# Patient Record
Sex: Female | Born: 1990 | Race: Black or African American | Hispanic: No | Marital: Single | Smoking: Former smoker
Health system: Southern US, Community
[De-identification: ages and names within clinical notes are randomized; demographics above are authoritative.]

## PROBLEM LIST (undated history)

## (undated) DIAGNOSIS — J40 Bronchitis, not specified as acute or chronic: Secondary | ICD-10-CM

## (undated) DIAGNOSIS — Z3480 Encounter for supervision of other normal pregnancy, unspecified trimester: Secondary | ICD-10-CM

## (undated) DIAGNOSIS — J45909 Unspecified asthma, uncomplicated: Secondary | ICD-10-CM

## (undated) DIAGNOSIS — D649 Anemia, unspecified: Secondary | ICD-10-CM

## (undated) HISTORY — DX: Encounter for supervision of other normal pregnancy, unspecified trimester: Z34.80

## (undated) HISTORY — DX: Bronchitis, not specified as acute or chronic: J40

## (undated) HISTORY — DX: Anemia, unspecified: D64.9

## (undated) HISTORY — DX: Unspecified asthma, uncomplicated: J45.909

---

## 2010-03-31 ENCOUNTER — Emergency Department (HOSPITAL_COMMUNITY): Admission: EM | Admit: 2010-03-31 | Discharge: 2010-03-31 | Payer: Self-pay | Admitting: Emergency Medicine

## 2011-03-04 IMAGING — CR DG CHEST 2V
2 series · 2 of 2 positions shown · non-contrast
Comparison: None

CLINICAL DATA: Chest tightness with shortness of breath and cough
for 3 days.  History of asthma

CHEST - 2 VIEW

[w chest pa]
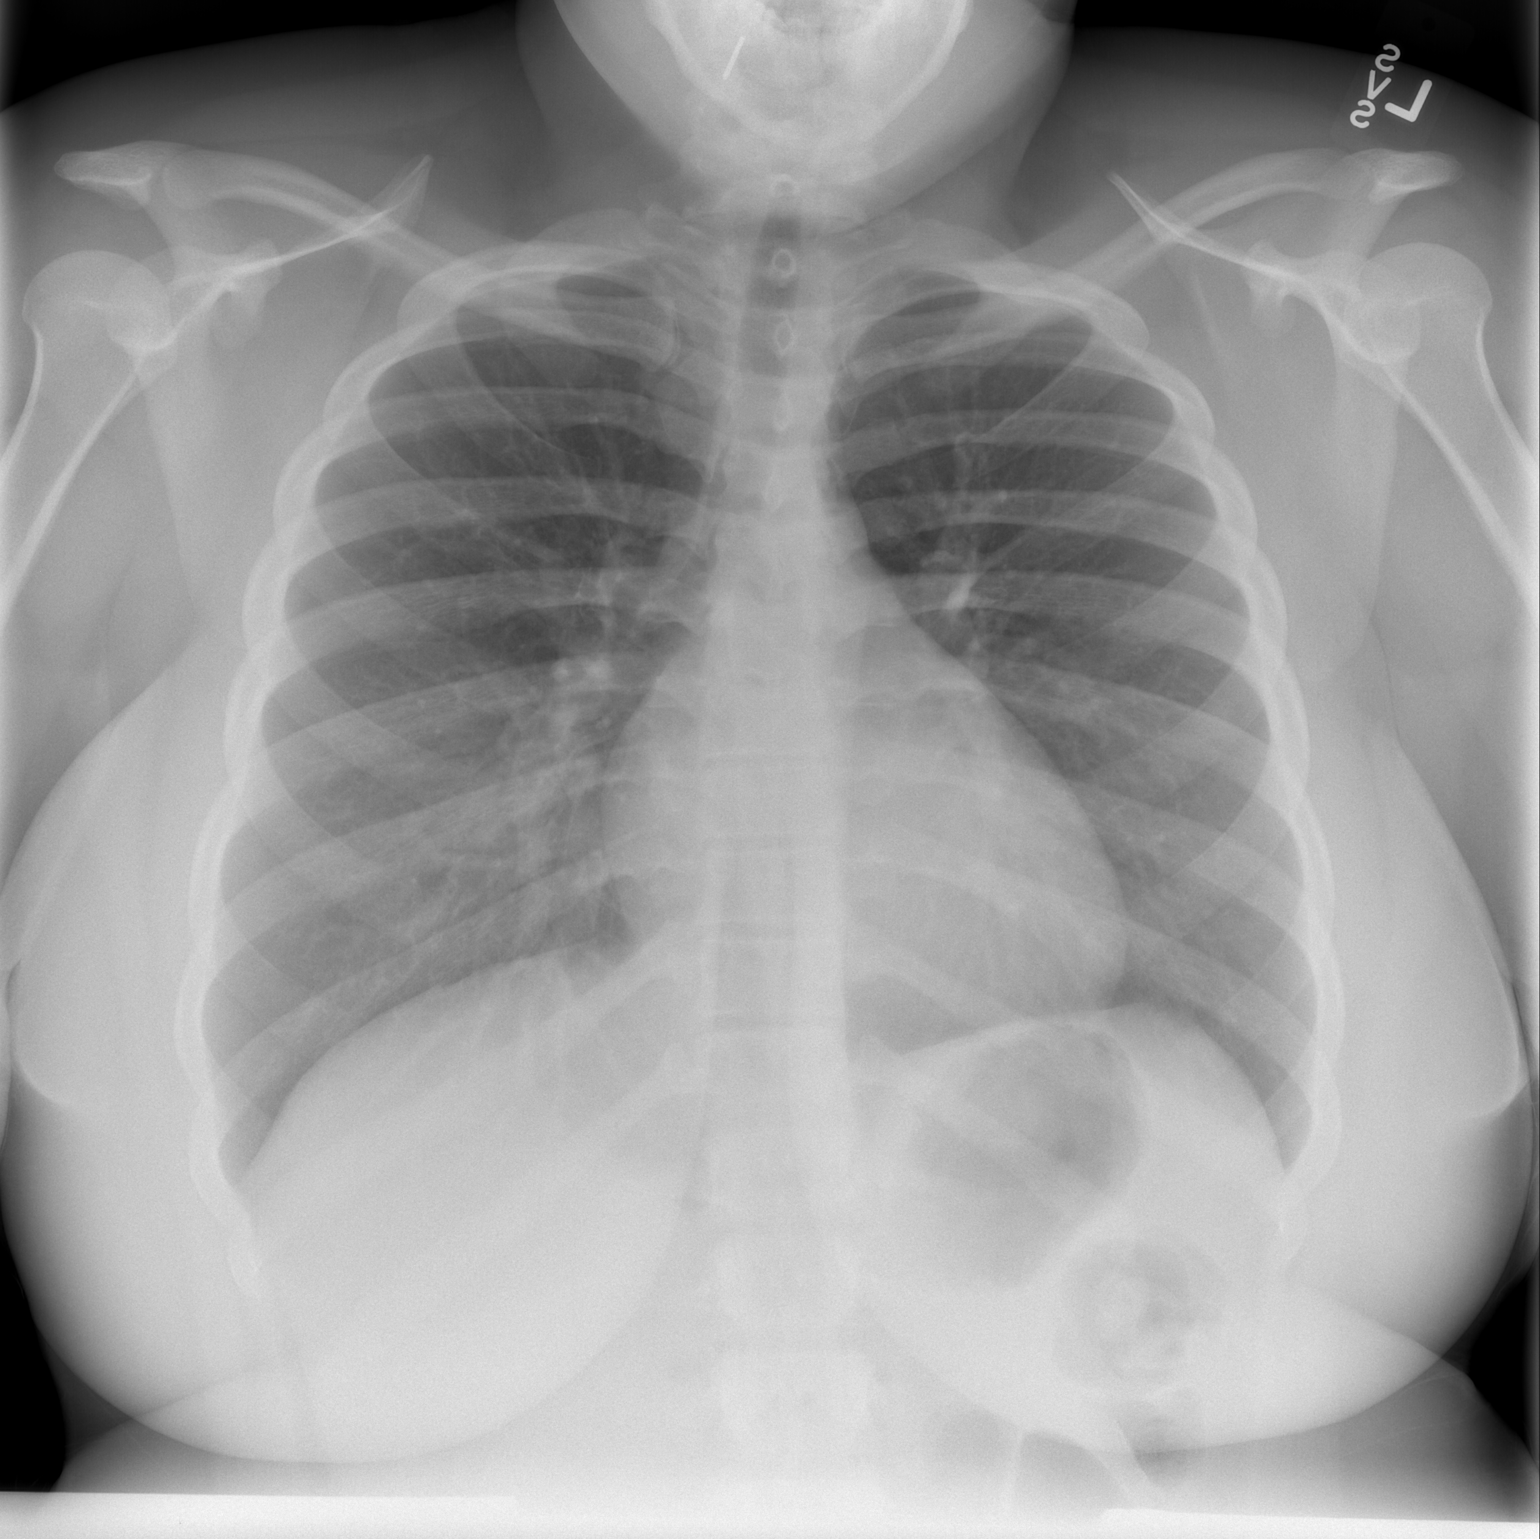

[w chest lat]
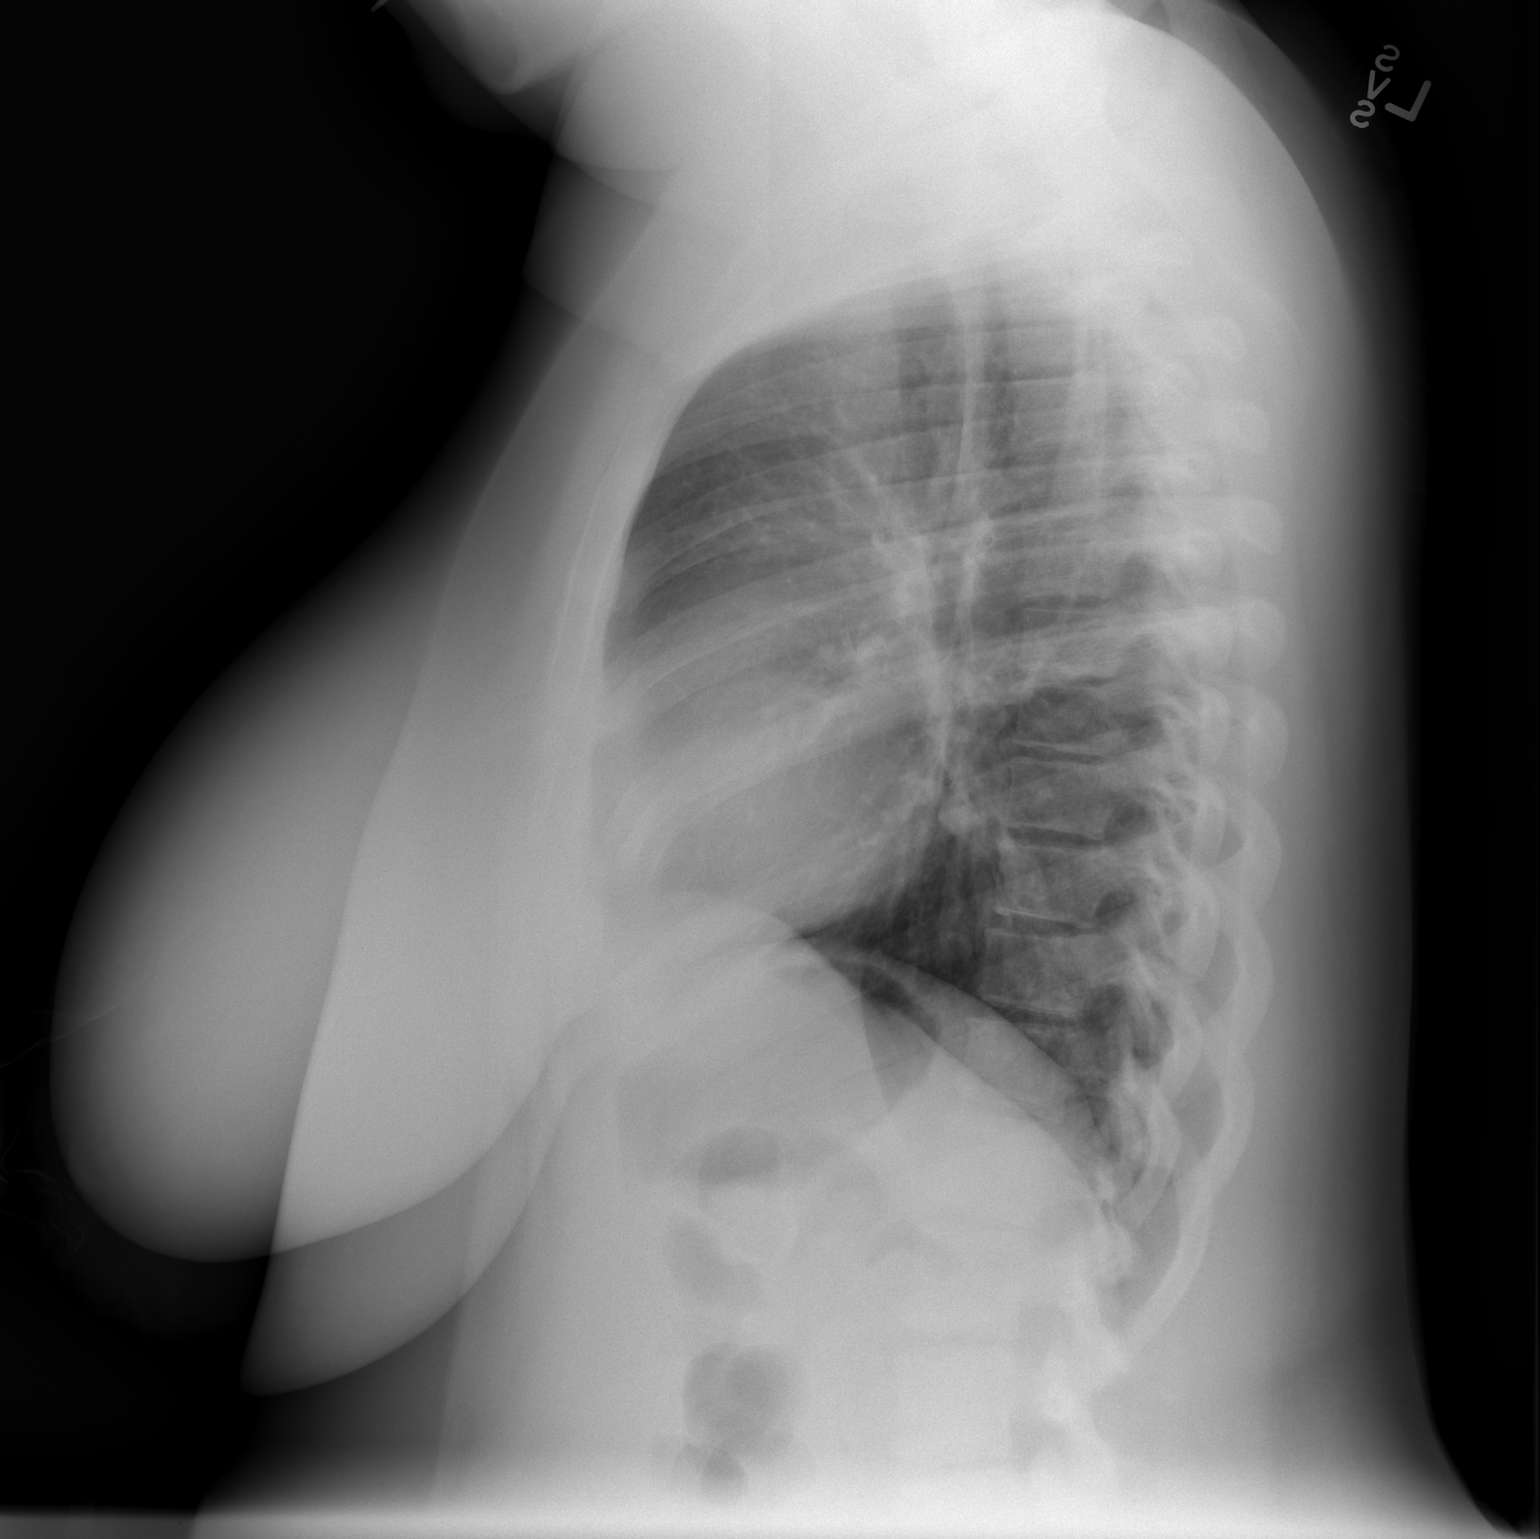

[2 of 2 positions shown; findings below may reference images not displayed]

FINDINGS: The lung fields are well expanded.  Heart and mediastinal
contours are within normal limits.  No focal infiltrate or signs of
congestive failure seen.  No pleural fluid is noted.  No
significant peribronchial cuffing is seen.  No pneumothorax or
hyperinflation is apparent.

Bony structures are intact.
IMPRESSION: No focal or acute cardiopulmonary abnormality noted.

## 2013-08-14 LAB — OB RESULTS CONSOLE HEPATITIS B SURFACE ANTIGEN: Hepatitis B Surface Ag: NEGATIVE

## 2013-08-14 LAB — OB RESULTS CONSOLE ABO/RH: RH Type: POSITIVE

## 2013-08-14 LAB — OB RESULTS CONSOLE ANTIBODY SCREEN: Antibody Screen: NEGATIVE

## 2013-08-14 LAB — OB RESULTS CONSOLE HIV ANTIBODY (ROUTINE TESTING): HIV: NONREACTIVE

## 2013-08-14 LAB — OB RESULTS CONSOLE RPR: RPR: NONREACTIVE

## 2013-08-15 LAB — OB RESULTS CONSOLE HGB/HCT, BLOOD
HCT: 37 %
Hemoglobin: 12.4 g/dL

## 2013-08-15 LAB — OB RESULTS CONSOLE PLATELET COUNT: PLATELETS: 247 10*3/uL

## 2013-08-15 LAB — OB RESULTS CONSOLE RUBELLA ANTIBODY, IGM: RUBELLA: IMMUNE

## 2013-08-28 LAB — HM PAP SMEAR: HM PAP: POSITIVE

## 2013-11-15 ENCOUNTER — Other Ambulatory Visit: Payer: Self-pay

## 2013-12-03 ENCOUNTER — Encounter: Payer: Self-pay | Admitting: *Deleted

## 2013-12-06 ENCOUNTER — Ambulatory Visit (INDEPENDENT_AMBULATORY_CARE_PROVIDER_SITE_OTHER): Payer: Medicaid Other | Admitting: Adult Health

## 2013-12-06 ENCOUNTER — Encounter: Payer: Self-pay | Admitting: *Deleted

## 2013-12-06 ENCOUNTER — Encounter: Payer: Self-pay | Admitting: Adult Health

## 2013-12-06 VITALS — BP 108/72 | Ht 62.0 in | Wt 226.5 lb

## 2013-12-06 DIAGNOSIS — Z3482 Encounter for supervision of other normal pregnancy, second trimester: Secondary | ICD-10-CM

## 2013-12-06 DIAGNOSIS — Z331 Pregnant state, incidental: Secondary | ICD-10-CM

## 2013-12-06 DIAGNOSIS — Z1389 Encounter for screening for other disorder: Secondary | ICD-10-CM

## 2013-12-06 DIAGNOSIS — Z3480 Encounter for supervision of other normal pregnancy, unspecified trimester: Secondary | ICD-10-CM

## 2013-12-06 HISTORY — DX: Encounter for supervision of other normal pregnancy, unspecified trimester: Z34.80

## 2013-12-06 LAB — POCT URINALYSIS DIPSTICK
Glucose, UA: NEGATIVE
Nitrite, UA: NEGATIVE

## 2013-12-06 MED ORDER — PRENATAL PLUS 27-1 MG PO TABS: 1.0000 | ORAL_TABLET | Freq: Every day | ORAL | Status: DC

## 2013-12-06 MED ORDER — BUTALBITAL-APAP-CAFFEINE 50-325-40 MG PO TABS: 1.0000 | ORAL_TABLET | Freq: Four times a day (QID) | ORAL | Status: AC | PRN

## 2013-12-06 MED ORDER — METRONIDAZOLE 500 MG PO TABS: ORAL_TABLET | ORAL | Status: DC

## 2013-12-06 NOTE — Patient Instructions (Signed)
Second Trimester of Pregnancy The second trimester is from week 13 through week 28, months 4 through 6. The second trimester is often a time when you feel your best. Your body has also adjusted to being pregnant, and you begin to feel better physically. Usually, morning sickness has lessened or quit completely, you may have more energy, and you may have an increase in appetite. The second trimester is also a time when the fetus is growing rapidly. At the end of the sixth month, the fetus is about 9 inches long and weighs about 1 pounds. You will likely begin to feel the baby move (quickening) between 18 and 20 weeks of the pregnancy. BODY CHANGES Your body goes through many changes during pregnancy. The changes vary from woman to woman.   Your weight will continue to increase. You will notice your lower abdomen bulging out.  You may begin to get stretch marks on your hips, abdomen, and breasts.  You may develop headaches that can be relieved by medicines approved by your health care provider.  You may urinate more often because the fetus is pressing on your bladder.  You may develop or continue to have heartburn as a result of your pregnancy.  You may develop constipation because certain hormones are causing the muscles that push waste through your intestines to slow down.  You may develop hemorrhoids or swollen, bulging veins (varicose veins).  You may have back pain because of the weight gain and pregnancy hormones relaxing your joints between the bones in your pelvis and as a result of a shift in weight and the muscles that support your balance.  Your breasts will continue to grow and be tender.  Your gums may bleed and may be sensitive to brushing and flossing.  Dark spots or blotches (chloasma, mask of pregnancy) may develop on your face. This will likely fade after the baby is born.  A dark line from your belly button to the pubic area (linea nigra) may appear. This will likely fade  after the baby is born.  You may have changes in your hair. These can include thickening of your hair, rapid growth, and changes in texture. Some women also have hair loss during or after pregnancy, or hair that feels dry or thin. Your hair will most likely return to normal after your baby is born. WHAT TO EXPECT AT YOUR PRENATAL VISITS During a routine prenatal visit:  You will be weighed to make sure you and the fetus are growing normally.  Your blood pressure will be taken.  Your abdomen will be measured to track your baby's growth.  The fetal heartbeat will be listened to.  Any test results from the previous visit will be discussed. Your health care provider may ask you:  How you are feeling.  If you are feeling the baby move.  If you have had any abnormal symptoms, such as leaking fluid, bleeding, severe headaches, or abdominal cramping.  If you have any questions. Other tests that may be performed during your second trimester include:  Blood tests that check for:  Low iron levels (anemia).  Gestational diabetes (between 24 and 28 weeks).  Rh antibodies.  Urine tests to check for infections, diabetes, or protein in the urine.  An ultrasound to confirm the proper growth and development of the baby.  An amniocentesis to check for possible genetic problems.  Fetal screens for spina bifida and Down syndrome. HOME CARE INSTRUCTIONS   Avoid all smoking, herbs, alcohol, and unprescribed   drugs. These chemicals affect the formation and growth of the baby.  Follow your health care provider's instructions regarding medicine use. There are medicines that are either safe or unsafe to take during pregnancy.  Exercise only as directed by your health care provider. Experiencing uterine cramps is a good sign to stop exercising.  Continue to eat regular, healthy meals.  Wear a good support bra for breast tenderness.  Do not use hot tubs, steam rooms, or saunas.  Wear your  seat belt at all times when driving.  Avoid raw meat, uncooked cheese, cat litter boxes, and soil used by cats. These carry germs that can cause birth defects in the baby.  Take your prenatal vitamins.  Try taking a stool softener (if your health care provider approves) if you develop constipation. Eat more high-fiber foods, such as fresh vegetables or fruit and whole grains. Drink plenty of fluids to keep your urine clear or pale yellow.  Take warm sitz baths to soothe any pain or discomfort caused by hemorrhoids. Use hemorrhoid cream if your health care provider approves.  If you develop varicose veins, wear support hose. Elevate your feet for 15 minutes, 3-4 times a day. Limit salt in your diet.  Avoid heavy lifting, wear low heel shoes, and practice good posture.  Rest with your legs elevated if you have leg cramps or low back pain.  Visit your dentist if you have not gone yet during your pregnancy. Use a soft toothbrush to brush your teeth and be gentle when you floss.  A sexual relationship may be continued unless your health care provider directs you otherwise.  Continue to go to all your prenatal visits as directed by your health care provider. SEEK MEDICAL CARE IF:   You have dizziness.  You have mild pelvic cramps, pelvic pressure, or nagging pain in the abdominal area.  You have persistent nausea, vomiting, or diarrhea.  You have a bad smelling vaginal discharge.  You have pain with urination. SEEK IMMEDIATE MEDICAL CARE IF:   You have a fever.  You are leaking fluid from your vagina.  You have spotting or bleeding from your vagina.  You have severe abdominal cramping or pain.  You have rapid weight gain or loss.  You have shortness of breath with chest pain.  You notice sudden or extreme swelling of your face, hands, ankles, feet, or legs.  You have not felt your baby move in over an hour.  You have severe headaches that do not go away with  medicine.  You have vision changes. Document Released: 06/01/2001 Document Revised: 06/12/2013 Document Reviewed: 08/08/2012 [facility] Patient Information 2015 [place], [place]. This information is not intended to replace advice given to you by your health care provider. Make sure you discuss any questions you have with your health care provider. Return in 1 week for US Trichomoniasis Trichomoniasis is an infection caused by an organism called Trichomonas. The infection can affect both women and men. In women, the outer female genitalia and the vagina are affected. In men, the penis is mainly affected, but the prostate and other reproductive organs can also be involved. Trichomoniasis is a sexually transmitted infection (STI) and is most often passed to another person through sexual contact.  RISK FACTORS  Having unprotected sexual intercourse.  Having sexual intercourse with an infected partner. SIGNS AND SYMPTOMS  Symptoms of trichomoniasis in women include:  Abnormal gray-green frothy vaginal discharge.  Itching and irritation of the vagina.  Itching and irritation of the area  outside the vagina. Symptoms of trichomoniasis in men include:   Penile discharge with or without pain.  Pain during urination. This results from inflammation of the urethra. DIAGNOSIS  Trichomoniasis may be found during a Pap test or physical exam. Your health care provider may use one of the following methods to help diagnose this infection:  Examining vaginal discharge under a microscope. For men, urethral discharge would be examined.  Testing the pH of the vagina with a test tape.  Using a vaginal swab test that checks for the Trichomonas organism. A test is available that provides results within a few minutes.  Doing a culture test for the organism. This is not usually needed. TREATMENT   You may be given medicine to fight the infection. Women should inform their health care provider if they could be  or are pregnant. Some medicines used to treat the infection should not be taken during pregnancy.  Your health care provider may recommend over-the-counter medicines or creams to decrease itching or irritation.  Your sexual partner will need to be treated if infected. HOME CARE INSTRUCTIONS   Take all medicine prescribed by your health care provider.  Take over-the-counter medicine for itching or irritation as directed by your health care provider.  Do not have sexual intercourse while you have the infection.  Women should not douche or wear tampons while they have the infection.  Discuss your infection with your partner. Your partner may have gotten the infection from you, or you may have gotten it from your partner.  Have your sex partner get examined and treated if necessary.  Practice safe, informed, and protected sex.  See your health care provider for other STI testing. SEEK MEDICAL CARE IF:   You still have symptoms after you finish your medicine.  You develop abdominal pain.  You have pain when you urinate.  You have bleeding after sexual intercourse.  You develop a rash.  Your medicine makes you sick or makes you throw up (vomit). Document Released: 12/01/2000 Document Revised: 06/12/2013 Document Reviewed: 03/19/2013 [facility] Patient Information 2015 [place], [place]. This information is not intended to replace advice given to you by your health care provider. Make sure you discuss any questions you have with your health care provider.

## 2013-12-06 NOTE — Progress Notes (Signed)
Subjective:  Julieanne CottonGloria Dansereau is a 23 y.o. 52P1001 African American female at 3573w3d by LMP  being seen today for her first obstetrical visit.  Her obstetrical history is significant for C section FTD.  Pregnancy history fully reviewed.  Patient reports headache. Denies vb, cramping, uti s/s, abnormal/malodorous vag d/c, or vulvovaginal itching/irritation.  BP 108/72  Wt 226 lb 8 oz (102.74 kg)  HISTORY: OB History  Gravida Para Term Preterm AB SAB TAB Ectopic Multiple Living  2 1 1       1     # Outcome Date GA Lbr Len/2nd Weight Sex Delivery Anes PTL Lv  2 CUR           1 TRM 06/22/12 988w0d  7 lb 15 oz (3.6 kg) F LTCS EPI  Y     Past Medical History  Diagnosis Date  . Anemia   . Asthma   . Bronchitis   . Supervision of other normal pregnancy 12/06/2013   Past Surgical History  Procedure Laterality Date  . Cesarean section     Family History  Problem Relation Age of Onset  . Diabetes Mother   . Heart disease Father   . Diabetes Sister   . Asthma Brother   . Kidney failure Maternal Grandmother   . Diabetes Maternal Grandmother   . Diabetes Maternal Grandfather   . Diabetes Paternal Grandmother   . Diabetes Paternal Grandfather     Exam   System:     General: Well developed & nourished, no acute distress   Skin: Warm & dry, normal coloration and turgor, no rashes   Neurologic: Alert & oriented, normal mood   Cardiovascular: Regular rate & rhythm   Respiratory: Effort & rate normal, LCTAB, acyanotic   Abdomen: Soft, non tender   Extremities: normal strength, tone                    Thyroid normal Pelvic Exam:    Perineum: deferred   Vulva: deferred   Vagina:  deferred   Cervix: deferred   Uterus: deferred    FHR: 145 via doppler   Assessment:   Pregnancy: G2P1001 Patient Active Problem List   Diagnosis Date Noted  . Supervision of other normal pregnancy 12/06/2013    8673w3d G2P1001 New OB visit     Plan:  I labs drawn Continue prenatal  vitamins Problem list reviewed and updated Reviewed n/v relief measures and warning s/s to report Reviewed recommended weight gain based on pre-gravid BMI Encouraged well-balanced diet Genetic Screening discussed Quad Screen: results reviewed Cystic fibrosis screening discussed declined Ultrasound discussed; fetal survey: requested Follow up in 1 weeks for anatomy US and see Dr Despina HiddenEure Rx flagyl 500 mg 4 take 4 po now Rx Fioricet #30 1 every 6 hours prn headache no refills Rx prenatal plus #30 1 daily with 11 refills  Adline PotterJennifer A. Griffin, NP 12/06/2013 11:59 AM

## 2013-12-07 LAB — DRUG SCREEN, URINE, NO CONFIRMATION
AMPHETAMINE SCRN UR: NEGATIVE
BENZODIAZEPINES.: NEGATIVE
Barbiturate Quant, Ur: NEGATIVE
Cocaine Metabolites: NEGATIVE
Creatinine,U: 584.8 mg/dL
MARIJUANA METABOLITE: POSITIVE — AB
Methadone: NEGATIVE
Opiate Screen, Urine: NEGATIVE
PHENCYCLIDINE (PCP): NEGATIVE
Propoxyphene: NEGATIVE

## 2013-12-07 LAB — VARICELLA ZOSTER ANTIBODY, IGG: VARICELLA IGG: 639.2 {index} — AB (ref <135.00)

## 2013-12-07 LAB — OXYCODONE SCREEN, UA, RFLX CONFIRM: Oxycodone Screen, Ur: NEGATIVE ng/mL

## 2013-12-08 LAB — GC/CHLAMYDIA PROBE AMP
CT Probe RNA: POSITIVE — AB
GC Probe RNA: NEGATIVE

## 2013-12-10 ENCOUNTER — Encounter: Payer: Self-pay | Admitting: Adult Health

## 2013-12-10 ENCOUNTER — Telehealth: Payer: Self-pay | Admitting: Adult Health

## 2013-12-10 NOTE — Telephone Encounter (Signed)
Left message to call in am  

## 2013-12-11 ENCOUNTER — Encounter: Payer: Self-pay | Admitting: Adult Health

## 2013-12-11 ENCOUNTER — Other Ambulatory Visit: Payer: Self-pay | Admitting: Adult Health

## 2013-12-11 ENCOUNTER — Telehealth: Payer: Self-pay | Admitting: Adult Health

## 2013-12-11 DIAGNOSIS — O99320 Drug use complicating pregnancy, unspecified trimester: Secondary | ICD-10-CM

## 2013-12-11 DIAGNOSIS — O0932 Supervision of pregnancy with insufficient antenatal care, second trimester: Secondary | ICD-10-CM

## 2013-12-11 DIAGNOSIS — F1920 Other psychoactive substance dependence, uncomplicated: Secondary | ICD-10-CM

## 2013-12-11 MED ORDER — AZITHROMYCIN 500 MG PO TABS: ORAL_TABLET | ORAL | Status: DC

## 2013-12-11 NOTE — Telephone Encounter (Signed)
[facility] of +[facility]lam[facility] ,treated with azithromycin 500 mg #2 po now do POC in 4 weeks

## 2013-12-11 NOTE — Telephone Encounter (Signed)
number invalid

## 2013-12-13 ENCOUNTER — Other Ambulatory Visit: Payer: Medicaid Other

## 2013-12-13 ENCOUNTER — Encounter: Payer: Self-pay | Admitting: *Deleted

## 2013-12-13 ENCOUNTER — Encounter: Payer: Medicaid Other | Admitting: Obstetrics & Gynecology

## 2014-01-10 ENCOUNTER — Ambulatory Visit (INDEPENDENT_AMBULATORY_CARE_PROVIDER_SITE_OTHER): Payer: Medicaid Other | Admitting: Obstetrics & Gynecology

## 2014-01-10 ENCOUNTER — Ambulatory Visit (INDEPENDENT_AMBULATORY_CARE_PROVIDER_SITE_OTHER): Payer: Medicaid Other

## 2014-01-10 ENCOUNTER — Other Ambulatory Visit: Payer: Self-pay | Admitting: Obstetrics & Gynecology

## 2014-01-10 VITALS — BP 100/60 | Wt 221.0 lb

## 2014-01-10 DIAGNOSIS — Z0375 Encounter for suspected cervical shortening ruled out: Secondary | ICD-10-CM

## 2014-01-10 DIAGNOSIS — Z331 Pregnant state, incidental: Secondary | ICD-10-CM

## 2014-01-10 DIAGNOSIS — O0930 Supervision of pregnancy with insufficient antenatal care, unspecified trimester: Secondary | ICD-10-CM

## 2014-01-10 DIAGNOSIS — O0932 Supervision of pregnancy with insufficient antenatal care, second trimester: Secondary | ICD-10-CM

## 2014-01-10 DIAGNOSIS — F1920 Other psychoactive substance dependence, uncomplicated: Secondary | ICD-10-CM

## 2014-01-10 DIAGNOSIS — O99320 Drug use complicating pregnancy, unspecified trimester: Secondary | ICD-10-CM

## 2014-01-10 DIAGNOSIS — Z3480 Encounter for supervision of other normal pregnancy, unspecified trimester: Secondary | ICD-10-CM

## 2014-01-10 DIAGNOSIS — Z1389 Encounter for screening for other disorder: Secondary | ICD-10-CM

## 2014-01-10 DIAGNOSIS — O2342 Unspecified infection of urinary tract in pregnancy, second trimester: Secondary | ICD-10-CM

## 2014-01-10 DIAGNOSIS — Z3483 Encounter for supervision of other normal pregnancy, third trimester: Secondary | ICD-10-CM

## 2014-01-10 LAB — POCT URINALYSIS DIPSTICK
Blood, UA: NEGATIVE
GLUCOSE UA: NEGATIVE
NITRITE UA: POSITIVE

## 2014-01-10 MED ORDER — NITROFURANTOIN MONOHYD MACRO 100 MG PO CAPS: 100.0000 mg | ORAL_CAPSULE | Freq: Two times a day (BID) | ORAL | Status: DC

## 2014-01-10 NOTE — Progress Notes (Signed)
U/S(28+3wks)-frank breech active fetus, meas c/w dates, fluid WNL, FHR-143 bpm, female fetus, posterior Gr 0 placenta, cx appears closed (measured vaginally)-4.5cm, bilateral adnexa appears WNL, no obvious abnl noted

## 2014-01-10 NOTE — Addendum Note (Signed)
Addended by: Lazaro ArmsEURE, Jamelle Noy H on: 01/10/2014 03:13 PM   Modules accepted: Orders

## 2014-01-10 NOTE — Addendum Note (Signed)
Addended by: Criss AlvinePULLIAM, CHRYSTAL G on: 01/10/2014 03:20 PM   Modules accepted: Orders

## 2014-01-10 NOTE — Progress Notes (Signed)
G2P1001 3266w3d Estimated Date of Delivery: 04/01/14  Blood pressure 100/60, weight 221 lb (100.245 kg).   BP weight and urine results all reviewed and noted.  Please refer to the obstetrical flow sheet for the fundal height and fetal heart rate documentation:  Patient reports good fetal movement, denies any bleeding and no rupture of membranes symptoms or regular contractions. Patient is without complaints. All questions were answered.  Plan:  Continued routine obstetrical care,   Follow up in 3 weeks for OB appointment, routine 1 week for PN2

## 2014-01-12 LAB — URINE CULTURE: Colony Count: >=100000

## 2014-01-17 ENCOUNTER — Other Ambulatory Visit: Payer: Medicaid Other

## 2014-01-31 ENCOUNTER — Encounter: Payer: Medicaid Other | Admitting: Advanced Practice Midwife

## 2014-03-12 ENCOUNTER — Ambulatory Visit (INDEPENDENT_AMBULATORY_CARE_PROVIDER_SITE_OTHER): Payer: Medicaid Other | Admitting: Women's Health

## 2014-03-12 ENCOUNTER — Encounter: Payer: Self-pay | Admitting: Women's Health

## 2014-03-12 VITALS — BP 124/74 | Wt 225.6 lb

## 2014-03-12 DIAGNOSIS — O34219 Maternal care for unspecified type scar from previous cesarean delivery: Secondary | ICD-10-CM

## 2014-03-12 DIAGNOSIS — O0932 Supervision of pregnancy with insufficient antenatal care, second trimester: Secondary | ICD-10-CM

## 2014-03-12 DIAGNOSIS — A749 Chlamydial infection, unspecified: Secondary | ICD-10-CM

## 2014-03-12 DIAGNOSIS — O23599 Infection of other part of genital tract in pregnancy, unspecified trimester: Secondary | ICD-10-CM

## 2014-03-12 DIAGNOSIS — Z1159 Encounter for screening for other viral diseases: Secondary | ICD-10-CM

## 2014-03-12 DIAGNOSIS — O98519 Other viral diseases complicating pregnancy, unspecified trimester: Secondary | ICD-10-CM

## 2014-03-12 DIAGNOSIS — Z331 Pregnant state, incidental: Secondary | ICD-10-CM

## 2014-03-12 DIAGNOSIS — F1290 Cannabis use, unspecified, uncomplicated: Secondary | ICD-10-CM

## 2014-03-12 DIAGNOSIS — Z1389 Encounter for screening for other disorder: Secondary | ICD-10-CM

## 2014-03-12 DIAGNOSIS — Z3483 Encounter for supervision of other normal pregnancy, third trimester: Secondary | ICD-10-CM

## 2014-03-12 DIAGNOSIS — O98812 Other maternal infectious and parasitic diseases complicating pregnancy, second trimester: Secondary | ICD-10-CM

## 2014-03-12 DIAGNOSIS — A5901 Trichomonal vulvovaginitis: Secondary | ICD-10-CM

## 2014-03-12 DIAGNOSIS — O0930 Supervision of pregnancy with insufficient antenatal care, unspecified trimester: Secondary | ICD-10-CM | POA: Insufficient documentation

## 2014-03-12 DIAGNOSIS — O2390 Unspecified genitourinary tract infection in pregnancy, unspecified trimester: Secondary | ICD-10-CM

## 2014-03-12 DIAGNOSIS — O0933 Supervision of pregnancy with insufficient antenatal care, third trimester: Secondary | ICD-10-CM

## 2014-03-12 DIAGNOSIS — O23592 Infection of other part of genital tract in pregnancy, second trimester: Secondary | ICD-10-CM

## 2014-03-12 DIAGNOSIS — Z3480 Encounter for supervision of other normal pregnancy, unspecified trimester: Secondary | ICD-10-CM

## 2014-03-12 DIAGNOSIS — Z3685 Encounter for antenatal screening for Streptococcus B: Secondary | ICD-10-CM

## 2014-03-12 LAB — POCT URINALYSIS DIPSTICK
Glucose, UA: NEGATIVE
KETONES UA: NEGATIVE
Nitrite, UA: NEGATIVE
Protein, UA: NEGATIVE

## 2014-03-12 NOTE — Progress Notes (Signed)
Low-risk OB appointment G2P1001 [redacted]w[redacted]d Estimated Date of Delivery: 04/01/14 BP 124/74  Wt 225 lb 9.6 oz (102.331 kg)  BP, weight reviewed.  Unable to void, will try again before she leaves. Refer to obstetrical flow sheet for FH & FHR.  Reports good fm.  Denies regular uc's, lof, vb, or uti s/s. RLP.  No care since 28wks- states her phone was messed up. Hasn't had pn2 or CT POC.  Discussed r/b VBAC vs. RLTCS, wants VBAC, consent signed.  THC earlier in pregnancy, will repeat today. Also CT POC today.  States she had early u/s in [place], will get that report to verify dates.  Wants nexplanon for contraception- will order today.   GBS collected SVE per request: cl/th/-3, unable to tell presentation, informal u/s->vtx  Reviewed labor s/s, fkc. Plan:  Continue routine obstetrical care  F/U asap for pn2 (no visit), then 1wk for OB appointment

## 2014-03-12 NOTE — Patient Instructions (Addendum)
[facility]: [address] [place]  You will have your sugar test next visit.  Please do not eat or drink anything after midnight the night before you come, not even water.  You will be here for at least two hours.     Call the office [phone]) or go to [facility] if:  You begin to have strong, frequent contractions  Your water breaks.  Sometimes it is a big gush of fluid, sometimes it is just a trickle that keeps getting your panties wet or running down your legs  You have vaginal bleeding.  It is normal to have a small amount of spotting if your cervix was checked.   You don't feel your baby moving like normal.  If you don't, get you something to eat and drink and lay down and focus on feeling your baby move.  You should feel at least 10 movements in 2 hours.  If you don't, you should call the office or go to [facility].    [place] Pediatricians:  [facility] [phone]            [facility] [phone]                 [facility] [phone] (usually doesn't accept new patients unless you have family there already, you are always welcome to call and ask)             [facility] ([address] [place]) [phone]   [facility] Pediatricians:   [facility]: [phone]  [facility]: [phone]   Deberah Pelton Contractions Contractions of the uterus can occur throughout pregnancy. Contractions are not always a sign that you are in labor.  WHAT ARE BRAXTON HICKS CONTRACTIONS?  Contractions that occur before labor are called Braxton Hicks contractions, or false labor. Toward the end of pregnancy (32-34 weeks), these contractions can develop more often and may become more forceful. This is not true labor because these contractions do not result in opening (dilatation) and thinning of the cervix. They are sometimes difficult to tell apart  from true labor because these contractions can be forceful and people have different pain tolerances. You should not feel embarrassed if you go to the hospital with false labor. Sometimes, the only way to tell if you are in true labor is for your health care provider to look for changes in the cervix. If there are no prenatal problems or other health problems associated with the pregnancy, it is completely safe to be sent home with false labor and await the onset of true labor. HOW CAN YOU TELL THE DIFFERENCE BETWEEN TRUE AND FALSE LABOR? False Labor  The contractions of false labor are usually shorter and not as hard as those of true labor.   The contractions are usually irregular.   The contractions are often felt in the front of the lower abdomen and in the groin.   The contractions may go away when you walk around or change positions while lying down.   The contractions get weaker and are shorter lasting as time goes on.   The contractions do not usually become progressively stronger, regular, and closer together as with true labor.  True Labor  Contractions in true labor last 30-70 seconds, become very regular, usually become more intense, and increase in frequency.   The contractions do not go away with walking.   The discomfort is usually felt in the top of the  uterus and spreads to the lower abdomen and low back.   True labor can be determined by your health care provider with an exam. This will show that the cervix is dilating and getting thinner.  WHAT TO REMEMBER  Keep up with your usual exercises and follow other instructions given by your health care provider.   Take medicines as directed by your health care provider.   Keep your regular prenatal appointments.   Eat and drink lightly if you think you are going into labor.   If Braxton Hicks contractions are making you uncomfortable:   Change your position from lying down or resting to walking, or from  walking to resting.   Sit and rest in a tub of warm water.   Drink 2-3 glasses of water. Dehydration may cause these contractions.   Do slow and deep breathing several times an hour.  WHEN SHOULD I SEEK IMMEDIATE MEDICAL CARE? Seek immediate medical care if:  Your contractions become stronger, more regular, and closer together.   You have fluid leaking or gushing from your vagina.   You have a fever.   You pass blood-tinged mucus.   You have vaginal bleeding.   You have continuous abdominal pain.   You have low back pain that you never had before.   You feel your baby's head pushing down and causing pelvic pressure.   Your baby is not moving as much as it used to.  Document Released: 06/07/2005 Document Revised: 06/12/2013 Document Reviewed: 03/19/2013 [facility] Patient Information 2015 [place], [place]. This information is not intended to replace advice given to you by your health care provider. Make sure you discuss any questions you have with your health care provider.

## 2014-03-13 ENCOUNTER — Encounter: Payer: Self-pay | Admitting: Women's Health

## 2014-03-13 ENCOUNTER — Telehealth: Payer: Self-pay | Admitting: Women's Health

## 2014-03-13 DIAGNOSIS — A749 Chlamydial infection, unspecified: Secondary | ICD-10-CM

## 2014-03-13 DIAGNOSIS — F1290 Cannabis use, unspecified, uncomplicated: Secondary | ICD-10-CM | POA: Insufficient documentation

## 2014-03-13 DIAGNOSIS — O98812 Other maternal infectious and parasitic diseases complicating pregnancy, second trimester: Principal | ICD-10-CM

## 2014-03-13 LAB — DRUG SCREEN, URINE, NO CONFIRMATION
AMPHETAMINE SCRN UR: NEGATIVE
BARBITURATE QUANT UR: NEGATIVE
BENZODIAZEPINES.: NEGATIVE
Cocaine Metabolites: NEGATIVE
Creatinine,U: 181.9 mg/dL
MARIJUANA METABOLITE: NEGATIVE
METHADONE: NEGATIVE
OPIATE SCREEN, URINE: NEGATIVE
PROPOXYPHENE: NEGATIVE
Phencyclidine (PCP): NEGATIVE

## 2014-03-13 LAB — GC/CHLAMYDIA PROBE AMP
CT Probe RNA: POSITIVE — AB
GC Probe RNA: NEGATIVE

## 2014-03-13 MED ORDER — AZITHROMYCIN 500 MG PO TABS: 1000.0000 mg | ORAL_TABLET | Freq: Once | ORAL | Status: DC

## 2014-03-13 NOTE — Telephone Encounter (Signed)
Attempted to call pt to notify of +CT. Left message w/ her mom for her to return call.  Cheral Marker, CNM, [facility] 03/13/2014

## 2014-03-13 NOTE — Telephone Encounter (Signed)
Notified pt of +CT, rx for azithromycin 1gm po x 1 sent to her pharmacy. Partner also needs to be treated either by me, pcp, or HD. Pt states he will go to his pcp. No sex x at least 7d after both treated.  Cheral Marker, CNM, [facility] 03/13/2014 12:14 PM

## 2014-03-14 LAB — STREP B DNA PROBE: GBSP: DETECTED

## 2014-03-15 ENCOUNTER — Other Ambulatory Visit: Payer: Medicaid Other

## 2014-03-18 ENCOUNTER — Encounter: Payer: Self-pay | Admitting: Women's Health

## 2014-03-20 ENCOUNTER — Other Ambulatory Visit: Payer: Medicaid Other

## 2014-03-20 ENCOUNTER — Encounter: Payer: Medicaid Other | Admitting: Advanced Practice Midwife

## 2014-03-20 ENCOUNTER — Encounter: Payer: Medicaid Other | Admitting: Obstetrics and Gynecology

## 2014-03-26 ENCOUNTER — Other Ambulatory Visit: Payer: Medicaid Other

## 2014-03-26 ENCOUNTER — Encounter: Payer: Medicaid Other | Admitting: Advanced Practice Midwife

## 2014-03-29 ENCOUNTER — Other Ambulatory Visit: Payer: Medicaid Other

## 2014-03-29 ENCOUNTER — Encounter: Payer: Self-pay | Admitting: Obstetrics & Gynecology

## 2014-03-29 ENCOUNTER — Ambulatory Visit (INDEPENDENT_AMBULATORY_CARE_PROVIDER_SITE_OTHER): Payer: Medicaid Other | Admitting: Obstetrics & Gynecology

## 2014-03-29 VITALS — BP 120/80 | Wt 223.0 lb

## 2014-03-29 DIAGNOSIS — Z1159 Encounter for screening for other viral diseases: Secondary | ICD-10-CM

## 2014-03-29 DIAGNOSIS — Z114 Encounter for screening for human immunodeficiency virus [HIV]: Secondary | ICD-10-CM

## 2014-03-29 DIAGNOSIS — Z113 Encounter for screening for infections with a predominantly sexual mode of transmission: Secondary | ICD-10-CM

## 2014-03-29 DIAGNOSIS — Z118 Encounter for screening for other infectious and parasitic diseases: Secondary | ICD-10-CM

## 2014-03-29 DIAGNOSIS — O34219 Maternal care for unspecified type scar from previous cesarean delivery: Secondary | ICD-10-CM

## 2014-03-29 DIAGNOSIS — Z331 Pregnant state, incidental: Secondary | ICD-10-CM

## 2014-03-29 DIAGNOSIS — Z3483 Encounter for supervision of other normal pregnancy, third trimester: Secondary | ICD-10-CM

## 2014-03-29 DIAGNOSIS — Z1389 Encounter for screening for other disorder: Secondary | ICD-10-CM

## 2014-03-29 DIAGNOSIS — O3421 Maternal care for scar from previous cesarean delivery: Secondary | ICD-10-CM

## 2014-03-29 DIAGNOSIS — Z0184 Encounter for antibody response examination: Secondary | ICD-10-CM

## 2014-03-29 LAB — CBC
HEMATOCRIT: 36.3 % (ref 36.0–46.0)
Hemoglobin: 13 g/dL (ref 12.0–15.0)
MCH: 29.5 pg (ref 26.0–34.0)
MCHC: 35.8 g/dL (ref 30.0–36.0)
MCV: 82.3 fL (ref 78.0–100.0)
PLATELETS: 303 10*3/uL (ref 150–400)
RBC: 4.41 MIL/uL (ref 3.87–5.11)
RDW: 14.3 % (ref 11.5–15.5)
WBC: 11.3 10*3/uL — AB (ref 4.0–10.5)

## 2014-03-29 LAB — RPR

## 2014-03-29 LAB — POCT URINALYSIS DIPSTICK
Glucose, UA: NEGATIVE
KETONES UA: NEGATIVE
Nitrite, UA: NEGATIVE
PROTEIN UA: NEGATIVE

## 2014-03-29 NOTE — Progress Notes (Signed)
This is patient's 4th prenatal visit, says she has some transportation issues, but she says she doesn't really have an excuse Has missed each attempt to do PN2, threw up today  G2P1001 4967w4d Estimated Date of Delivery: 04/01/14  Blood pressure 120/80, weight 223 lb (101.152 kg).   BP weight and urine results all reviewed and noted.  Please refer to the obstetrical flow sheet for the fundal height and fetal heart rate documentation:  Patient reports good fetal movement, denies any bleeding and no rupture of membranes symptoms or regular contractions. Patient is without complaints. All questions were answered.  Plan:  Continued routine obstetrical care,   Follow up in 1 weeks for OB appointment, routine

## 2014-03-29 NOTE — Addendum Note (Signed)
Addended by: Colen DarlingYOUNG, Skylynn Burkley S on: 03/29/2014 12:14 PM   Modules accepted: Orders

## 2014-03-29 NOTE — Addendum Note (Signed)
Addended by: Colen DarlingYOUNG, JANET S on: 03/29/2014 10:37 AM   Modules accepted: Orders

## 2014-03-30 LAB — HIV ANTIBODY (ROUTINE TESTING W REFLEX): HIV 1&2 Ab, 4th Generation: NONREACTIVE

## 2014-03-30 LAB — ANTIBODY SCREEN: Antibody Screen: NEGATIVE

## 2014-04-02 LAB — HSV 2 ANTIBODY, IGG: HSV 2 Glycoprotein G Ab, IgG: 4.83 IV — ABNORMAL HIGH

## 2014-04-05 ENCOUNTER — Other Ambulatory Visit: Payer: Self-pay | Admitting: Obstetrics and Gynecology

## 2014-04-05 ENCOUNTER — Telehealth: Payer: Self-pay | Admitting: Obstetrics and Gynecology

## 2014-04-05 ENCOUNTER — Encounter: Payer: Self-pay | Admitting: Obstetrics and Gynecology

## 2014-04-05 ENCOUNTER — Ambulatory Visit (INDEPENDENT_AMBULATORY_CARE_PROVIDER_SITE_OTHER): Payer: Medicaid Other | Admitting: Obstetrics and Gynecology

## 2014-04-05 VITALS — BP 110/70 | Wt 225.0 lb

## 2014-04-05 DIAGNOSIS — N898 Other specified noninflammatory disorders of vagina: Secondary | ICD-10-CM

## 2014-04-05 DIAGNOSIS — O480 Post-term pregnancy: Secondary | ICD-10-CM

## 2014-04-05 DIAGNOSIS — Z3685 Encounter for antenatal screening for Streptococcus B: Secondary | ICD-10-CM

## 2014-04-05 DIAGNOSIS — Z1389 Encounter for screening for other disorder: Secondary | ICD-10-CM

## 2014-04-05 DIAGNOSIS — Z118 Encounter for screening for other infectious and parasitic diseases: Secondary | ICD-10-CM

## 2014-04-05 DIAGNOSIS — Z331 Pregnant state, incidental: Secondary | ICD-10-CM

## 2014-04-05 DIAGNOSIS — Z113 Encounter for screening for infections with a predominantly sexual mode of transmission: Secondary | ICD-10-CM

## 2014-04-05 DIAGNOSIS — O26893 Other specified pregnancy related conditions, third trimester: Secondary | ICD-10-CM

## 2014-04-05 MED ORDER — METRONIDAZOLE 500 MG PO TABS
ORAL_TABLET | ORAL | Status: DC
Start: 2014-04-05 — End: 2014-04-08

## 2014-04-05 NOTE — Telephone Encounter (Signed)
rx sent for metronidazole to [facility], [place].

## 2014-04-05 NOTE — Progress Notes (Signed)
Pt denies any problems or concerns at this time.  

## 2014-04-05 NOTE — H&P (Signed)
Teresa Washington is a 23 y.o. female presenting for  Repeat Cesarean Section. Indications Previous Cesarean Section declining Trial of labor T. History this 23 yr old female 1664w4d G2P1001 [facility] is admitted for repeat cesarean section after presenting for her routine prenatal visit today, having reached 40 wk 4 days, and having no interest in considering Induction of labor, and requesting repeat cesarean section. She had a prior induction at 41 weeks, and after a 2 day induction was taken for cesarean delivery. The cervix is posterior long and firm, with a high presenting part, and pt is not interested in the possibilty of a long induction resulting in the same result as last labor induction. NST is reactive, with 2 small variable decelerations noted, and AFI is normal at 10.2.  Fetal movement remains normal.She is scheduled for Cesarean in the morning.  Prenatal course has been notable for limited prenatal care, with second trimester onset of care, failure to keep prenatal appts from 28-37 weeks, and treatment x 2 for Chlamydia, with proof of cure collected again today, 10/16. Trichomonas vaginitis dx'd again today, and treatment called in to pt pharmacy of choice with instructions to complete given to pt. If pt did not fill rx, will give IV Flagyl x 1.  OB History   Grav Para Term Preterm Abortions TAB SAB Ect Mult Living   2 1 1       1      Past Medical History  Diagnosis Date  . Anemia   . Asthma   . Bronchitis   . Supervision of other normal pregnancy 12/06/2013   Past Surgical History  Procedure Laterality Date  . Cesarean section     Family History: family history includes Asthma in her brother; Diabetes in her maternal grandfather, maternal grandmother, mother, paternal grandfather, paternal grandmother, and sister; Heart disease in her father; Kidney failure in her maternal grandmother. Social History:  reports that she has quit smoking.  Her smoking use included Cigarettes. She smoked 0.00 packs per day. She has never used smokeless tobacco. She reports that she does not drink alcohol or use illicit drugs.   Prenatal Transfer Tool  Maternal Diabetes: never got Prenatal screen due to missed appts Genetic Screening: Declined due to late presentation Maternal Ultrasounds/Referrals: Normal only u/s at 28 wks normal female infant, normal anatomy scan, corresponds to menstrual EDD within 5 days. Fetal Ultrasounds or other Referrals:  None Maternal Substance Abuse:  Yes:  Type: Marijuana Significant Maternal Medications:  Meds include: Other:  metronidazole rx'd 10 /16 Significant Maternal Lab Results:  Lab values include: Group B Strep positive Other Comments:    ROS    There were no vitals taken for this visit. Exam Physical Exam  Constitutional: She is oriented to person, place, and time. She appears well-developed and well-nourished.  Poor hygiene, clothing soiled  HENT:  Head: Normocephalic.  Eyes: Pupils are equal, round, and reactive to light.  Neck: Neck supple.  Cardiovascular: Normal rate.   Respiratory: Effort normal.  GI: Soft.  Gravid uterus consistent with term gestation  Genitourinary:  Profuse d/c  + trich,rx'd 10 16 Speculum check Cervix long closed Digital exam: long closed posterior with ballottable presenting part at -3 station U/S shows normal AFI  , good fetal activity.  NST reactive   Neurological: She is alert and oriented to person, place, and time.  Skin: Skin is warm and dry.  Psychiatric: She has a normal mood and affect.  Her behavior is normal.    Prenatal labs: ABO, Rh: O/Positive/-- (02/24 0000) Antibody: NEG (10/09 0916) Rubella: Immune (02/25 0000) RPR: NON REAC (10/09 0916)  HBsAg: Negative (02/24 0000)  HIV: NONREACTIVE (10/09 16100916)  GBS: Detected (09/22 1218)   Assessment/Plan: Pregnancy 40 wk 4 days Trichomonas Vaginitis Hx Chlamydia x 2. Poor prenatal care  Repeat  cesarean in a.m. Risks benefits rationale reviewed with patient.  Pt given NPO instructions, to arrive at hospital at 7 am. Surgery scheduled for 9 am.   Teresa Washington,Colinda Barth V 04/05/2014, 9:38 PM

## 2014-04-06 ENCOUNTER — Encounter (HOSPITAL_COMMUNITY): Payer: Self-pay | Admitting: Anesthesiology

## 2014-04-06 ENCOUNTER — Ambulatory Visit (HOSPITAL_COMMUNITY): Payer: Medicaid Other | Admitting: Anesthesiology

## 2014-04-06 ENCOUNTER — Encounter (HOSPITAL_COMMUNITY)
Admission: RE | Disposition: A | Discharge status: Home / Self Care | Payer: Self-pay | Source: Ambulatory Visit | Attending: Obstetrics and Gynecology

## 2014-04-06 ENCOUNTER — Encounter (HOSPITAL_COMMUNITY): Payer: Medicaid Other | Admitting: Anesthesiology

## 2014-04-06 ENCOUNTER — Inpatient Hospital Stay (HOSPITAL_COMMUNITY)
Admission: RE | Admit: 2014-04-06 | Discharge: 2014-04-08 | DRG: 765 | Disposition: A | Discharge status: Home / Self Care | Payer: Medicaid Other | Source: Ambulatory Visit | Attending: Obstetrics and Gynecology | Admitting: Obstetrics and Gynecology

## 2014-04-06 DIAGNOSIS — O3421 Maternal care for scar from previous cesarean delivery: Principal | ICD-10-CM | POA: Diagnosis present

## 2014-04-06 DIAGNOSIS — Z87891 Personal history of nicotine dependence: Secondary | ICD-10-CM

## 2014-04-06 DIAGNOSIS — Z3A40 40 weeks gestation of pregnancy: Secondary | ICD-10-CM | POA: Diagnosis present

## 2014-04-06 DIAGNOSIS — A5609 Other chlamydial infection of lower genitourinary tract: Secondary | ICD-10-CM | POA: Diagnosis present

## 2014-04-06 DIAGNOSIS — N736 Female pelvic peritoneal adhesions (postinfective): Secondary | ICD-10-CM | POA: Diagnosis present

## 2014-04-06 DIAGNOSIS — A5901 Trichomonal vulvovaginitis: Secondary | ICD-10-CM | POA: Diagnosis present

## 2014-04-06 DIAGNOSIS — O0933 Supervision of pregnancy with insufficient antenatal care, third trimester: Secondary | ICD-10-CM

## 2014-04-06 DIAGNOSIS — Z833 Family history of diabetes mellitus: Secondary | ICD-10-CM | POA: Diagnosis not present

## 2014-04-06 DIAGNOSIS — O9832 Other infections with a predominantly sexual mode of transmission complicating childbirth: Secondary | ICD-10-CM | POA: Diagnosis present

## 2014-04-06 DIAGNOSIS — Z98891 History of uterine scar from previous surgery: Secondary | ICD-10-CM

## 2014-04-06 DIAGNOSIS — O99824 Streptococcus B carrier state complicating childbirth: Secondary | ICD-10-CM | POA: Diagnosis present

## 2014-04-06 LAB — URINALYSIS, ROUTINE W REFLEX MICROSCOPIC
Bilirubin Urine: NEGATIVE
GLUCOSE, UA: NEGATIVE mg/dL
KETONES UR: NEGATIVE mg/dL
Nitrite: NEGATIVE
Protein, ur: NEGATIVE mg/dL
Specific Gravity, Urine: 1.02 (ref 1.005–1.030)
UROBILINOGEN UA: 0.2 mg/dL (ref 0.0–1.0)
pH: 6 (ref 5.0–8.0)

## 2014-04-06 LAB — CBC
HEMATOCRIT: 35.1 % — AB (ref 36.0–46.0)
HEMOGLOBIN: 12.5 g/dL (ref 12.0–15.0)
MCH: 30.3 pg (ref 26.0–34.0)
MCHC: 35.6 g/dL (ref 30.0–36.0)
MCV: 85 fL (ref 78.0–100.0)
Platelets: 264 10*3/uL (ref 150–400)
RBC: 4.13 MIL/uL (ref 3.87–5.11)
RDW: 13.3 % (ref 11.5–15.5)
WBC: 11.3 10*3/uL — AB (ref 4.0–10.5)

## 2014-04-06 LAB — GC/CHLAMYDIA PROBE AMP
CT Probe RNA: POSITIVE — AB
GC Probe RNA: NEGATIVE

## 2014-04-06 LAB — TYPE AND SCREEN
ABO/RH(D): O NEG
Antibody Screen: NEGATIVE

## 2014-04-06 LAB — URINE MICROSCOPIC-ADD ON

## 2014-04-06 LAB — ABO/RH: ABO/RH(D): O NEG

## 2014-04-06 LAB — RPR

## 2014-04-06 SURGERY — Surgical Case
Anesthesia: Spinal | Site: Abdomen

## 2014-04-06 MED ORDER — KETOROLAC TROMETHAMINE 30 MG/ML IJ SOLN
30.0000 mg | Freq: Four times a day (QID) | INTRAMUSCULAR | Status: AC | PRN
  Administered 2014-04-06: 30 mg via INTRAVENOUS

## 2014-04-06 MED ORDER — CEFAZOLIN SODIUM-DEXTROSE 2-3 GM-% IV SOLR
INTRAVENOUS | Status: AC
  Filled 2014-04-06: qty 50

## 2014-04-06 MED ORDER — MORPHINE SULFATE 0.5 MG/ML IJ SOLN
INTRAMUSCULAR | Status: AC
  Filled 2014-04-06: qty 10

## 2014-04-06 MED ORDER — TETANUS-DIPHTH-ACELL PERTUSSIS 5-2.5-18.5 LF-MCG/0.5 IM SUSP: 0.5000 mL | Freq: Once | INTRAMUSCULAR | Status: DC

## 2014-04-06 MED ORDER — ZOLPIDEM TARTRATE 5 MG PO TABS: 5.0000 mg | ORAL_TABLET | Freq: Every evening | ORAL | Status: DC | PRN

## 2014-04-06 MED ORDER — LACTATED RINGERS IV BOLUS (SEPSIS)
1000.0000 mL | Freq: Once | INTRAVENOUS | Status: AC
  Administered 2014-04-06: 1000 mL via INTRAVENOUS

## 2014-04-06 MED ORDER — INFLUENZA VAC SPLIT QUAD 0.5 ML IM SUSY: 0.5000 mL | PREFILLED_SYRINGE | INTRAMUSCULAR | Status: DC

## 2014-04-06 MED ORDER — OXYTOCIN 10 UNIT/ML IJ SOLN
INTRAMUSCULAR | Status: AC
  Filled 2014-04-06: qty 4

## 2014-04-06 MED ORDER — FENTANYL CITRATE 0.05 MG/ML IJ SOLN
INTRAMUSCULAR | Status: DC | PRN
  Administered 2014-04-06: 12.5 ug via INTRATHECAL

## 2014-04-06 MED ORDER — SCOPOLAMINE 1 MG/3DAYS TD PT72
1.0000 | MEDICATED_PATCH | Freq: Once | TRANSDERMAL | Status: DC
  Filled 2014-04-06: qty 1

## 2014-04-06 MED ORDER — NALBUPHINE HCL 10 MG/ML IJ SOLN: 5.0000 mg | INTRAMUSCULAR | Status: DC | PRN

## 2014-04-06 MED ORDER — WITCH HAZEL-GLYCERIN EX PADS: 1.0000 "application " | MEDICATED_PAD | CUTANEOUS | Status: DC | PRN

## 2014-04-06 MED ORDER — LACTATED RINGERS IV SOLN: INTRAVENOUS | Status: DC

## 2014-04-06 MED ORDER — NALBUPHINE HCL 10 MG/ML IJ SOLN: 5.0000 mg | Freq: Once | INTRAMUSCULAR | Status: AC | PRN

## 2014-04-06 MED ORDER — SIMETHICONE 80 MG PO CHEW: 80.0000 mg | CHEWABLE_TABLET | ORAL | Status: DC | PRN

## 2014-04-06 MED ORDER — MEPERIDINE HCL 25 MG/ML IJ SOLN: 6.2500 mg | INTRAMUSCULAR | Status: DC | PRN

## 2014-04-06 MED ORDER — AZITHROMYCIN 500 MG PO TABS
1000.0000 mg | ORAL_TABLET | Freq: Once | ORAL | Status: AC
  Administered 2014-04-06: 1000 mg via ORAL
  Filled 2014-04-06: qty 2

## 2014-04-06 MED ORDER — DIPHENHYDRAMINE HCL 25 MG PO CAPS: 25.0000 mg | ORAL_CAPSULE | ORAL | Status: DC | PRN

## 2014-04-06 MED ORDER — PRENATAL MULTIVITAMIN CH
1.0000 | ORAL_TABLET | Freq: Every day | ORAL | Status: DC
  Administered 2014-04-06: 1 via ORAL
  Filled 2014-04-06 (×3): qty 1

## 2014-04-06 MED ORDER — PNEUMOCOCCAL VAC POLYVALENT 25 MCG/0.5ML IJ INJ
0.5000 mL | INJECTION | INTRAMUSCULAR | Status: DC
  Filled 2014-04-06: qty 0.5

## 2014-04-06 MED ORDER — NALOXONE HCL 0.4 MG/ML IJ SOLN: 0.4000 mg | INTRAMUSCULAR | Status: DC | PRN

## 2014-04-06 MED ORDER — LANOLIN HYDROUS EX OINT: 1.0000 "application " | TOPICAL_OINTMENT | CUTANEOUS | Status: DC | PRN

## 2014-04-06 MED ORDER — DIPHENHYDRAMINE HCL 50 MG/ML IJ SOLN: 12.5000 mg | INTRAMUSCULAR | Status: DC | PRN

## 2014-04-06 MED ORDER — BUPIVACAINE IN DEXTROSE 0.75-8.25 % IT SOLN
INTRATHECAL | Status: DC | PRN
  Administered 2014-04-06: 1.6 mL via INTRATHECAL

## 2014-04-06 MED ORDER — DIBUCAINE 1 % RE OINT: 1.0000 "application " | TOPICAL_OINTMENT | RECTAL | Status: DC | PRN

## 2014-04-06 MED ORDER — SIMETHICONE 80 MG PO CHEW
80.0000 mg | CHEWABLE_TABLET | Freq: Three times a day (TID) | ORAL | Status: DC
  Administered 2014-04-06 – 2014-04-07 (×5): 80 mg via ORAL
  Filled 2014-04-06 (×6): qty 1

## 2014-04-06 MED ORDER — OXYCODONE-ACETAMINOPHEN 5-325 MG PO TABS: 1.0000 | ORAL_TABLET | ORAL | Status: DC | PRN

## 2014-04-06 MED ORDER — ONDANSETRON HCL 4 MG/2ML IJ SOLN
INTRAMUSCULAR | Status: AC
  Filled 2014-04-06: qty 2

## 2014-04-06 MED ORDER — ONDANSETRON HCL 4 MG PO TABS: 4.0000 mg | ORAL_TABLET | ORAL | Status: DC | PRN

## 2014-04-06 MED ORDER — IBUPROFEN 600 MG PO TABS
600.0000 mg | ORAL_TABLET | Freq: Four times a day (QID) | ORAL | Status: DC
  Administered 2014-04-06 – 2014-04-08 (×8): 600 mg via ORAL
  Filled 2014-04-06 (×8): qty 1

## 2014-04-06 MED ORDER — SIMETHICONE 80 MG PO CHEW
80.0000 mg | CHEWABLE_TABLET | ORAL | Status: DC
  Administered 2014-04-06 – 2014-04-07 (×2): 80 mg via ORAL
  Filled 2014-04-06 (×2): qty 1

## 2014-04-06 MED ORDER — LACTATED RINGERS IV SOLN
INTRAVENOUS | Status: DC
  Administered 2014-04-06: 1000 mL via INTRAVENOUS

## 2014-04-06 MED ORDER — FENTANYL CITRATE 0.05 MG/ML IJ SOLN
INTRAMUSCULAR | Status: AC
  Filled 2014-04-06: qty 2

## 2014-04-06 MED ORDER — DIPHENHYDRAMINE HCL 25 MG PO CAPS: 25.0000 mg | ORAL_CAPSULE | Freq: Four times a day (QID) | ORAL | Status: DC | PRN

## 2014-04-06 MED ORDER — METRONIDAZOLE 500 MG PO TABS
500.0000 mg | ORAL_TABLET | Freq: Two times a day (BID) | ORAL | Status: DC
  Administered 2014-04-06 – 2014-04-07 (×4): 500 mg via ORAL
  Filled 2014-04-06 (×5): qty 1

## 2014-04-06 MED ORDER — PHENYLEPHRINE 8 MG IN D5W 100 ML (0.08MG/ML) PREMIX OPTIME
INJECTION | INTRAVENOUS | Status: DC | PRN
  Administered 2014-04-06: 60 ug/min via INTRAVENOUS

## 2014-04-06 MED ORDER — KETOROLAC TROMETHAMINE 30 MG/ML IJ SOLN
INTRAMUSCULAR | Status: AC
  Filled 2014-04-06: qty 1

## 2014-04-06 MED ORDER — KETOROLAC TROMETHAMINE 30 MG/ML IJ SOLN: 30.0000 mg | Freq: Four times a day (QID) | INTRAMUSCULAR | Status: AC | PRN

## 2014-04-06 MED ORDER — ONDANSETRON HCL 4 MG/2ML IJ SOLN
4.0000 mg | Freq: Three times a day (TID) | INTRAMUSCULAR | Status: DC | PRN
Start: 2014-04-06 — End: 2014-04-08

## 2014-04-06 MED ORDER — SODIUM CHLORIDE 0.9 % IJ SOLN: 3.0000 mL | INTRAMUSCULAR | Status: DC | PRN

## 2014-04-06 MED ORDER — NALOXONE HCL 1 MG/ML IJ SOLN
1.0000 ug/kg/h | INTRAVENOUS | Status: DC | PRN
  Filled 2014-04-06: qty 2

## 2014-04-06 MED ORDER — LACTATED RINGERS IV SOLN
INTRAVENOUS | Status: DC | PRN
  Administered 2014-04-06 (×3): via INTRAVENOUS

## 2014-04-06 MED ORDER — MENTHOL 3 MG MT LOZG: 1.0000 | LOZENGE | OROMUCOSAL | Status: DC | PRN

## 2014-04-06 MED ORDER — SENNOSIDES-DOCUSATE SODIUM 8.6-50 MG PO TABS
2.0000 | ORAL_TABLET | ORAL | Status: DC
  Administered 2014-04-06 – 2014-04-07 (×2): 2 via ORAL
  Filled 2014-04-06 (×2): qty 2

## 2014-04-06 MED ORDER — OXYCODONE-ACETAMINOPHEN 5-325 MG PO TABS
2.0000 | ORAL_TABLET | ORAL | Status: DC | PRN
  Administered 2014-04-07: 2 via ORAL
  Filled 2014-04-06: qty 2

## 2014-04-06 MED ORDER — LACTATED RINGERS IV SOLN
INTRAVENOUS | Status: DC
  Administered 2014-04-06: 23:00:00 via INTRAVENOUS

## 2014-04-06 MED ORDER — MORPHINE SULFATE (PF) 0.5 MG/ML IJ SOLN
INTRAMUSCULAR | Status: DC | PRN
  Administered 2014-04-06: .2 mg via INTRATHECAL

## 2014-04-06 MED ORDER — SCOPOLAMINE 1 MG/3DAYS TD PT72: 1.0000 | MEDICATED_PATCH | Freq: Once | TRANSDERMAL | Status: DC

## 2014-04-06 MED ORDER — CEFAZOLIN SODIUM-DEXTROSE 2-3 GM-% IV SOLR
2.0000 g | INTRAVENOUS | Status: AC
  Administered 2014-04-06: 2 g via INTRAVENOUS

## 2014-04-06 MED ORDER — ONDANSETRON HCL 4 MG/2ML IJ SOLN: 4.0000 mg | INTRAMUSCULAR | Status: DC | PRN

## 2014-04-06 MED ORDER — AZITHROMYCIN 1 G PO PACK: 1.0000 g | PACK | Freq: Once | ORAL | Status: DC

## 2014-04-06 MED ORDER — ONDANSETRON HCL 4 MG/2ML IJ SOLN
INTRAMUSCULAR | Status: DC | PRN
  Administered 2014-04-06: 4 mg via INTRAVENOUS

## 2014-04-06 MED ORDER — FENTANYL CITRATE 0.05 MG/ML IJ SOLN: 25.0000 ug | INTRAMUSCULAR | Status: DC | PRN

## 2014-04-06 MED ORDER — SODIUM CHLORIDE 0.9 % IV SOLN: INTRAVENOUS | Status: DC

## 2014-04-06 MED ORDER — PHENYLEPHRINE 8 MG IN D5W 100 ML (0.08MG/ML) PREMIX OPTIME
INJECTION | INTRAVENOUS | Status: AC
  Filled 2014-04-06: qty 100

## 2014-04-06 MED ORDER — SCOPOLAMINE 1 MG/3DAYS TD PT72
MEDICATED_PATCH | TRANSDERMAL | Status: AC
  Administered 2014-04-06: 1.5 mg via TRANSDERMAL
  Filled 2014-04-06: qty 1

## 2014-04-06 MED ORDER — OXYTOCIN 10 UNIT/ML IJ SOLN
40.0000 [IU] | INTRAVENOUS | Status: DC | PRN
  Administered 2014-04-06: 40 [IU] via INTRAVENOUS

## 2014-04-06 MED ORDER — OXYTOCIN 40 UNITS IN LACTATED RINGERS INFUSION - SIMPLE MED: 62.5000 mL/h | INTRAVENOUS | Status: AC

## 2014-04-06 SURGICAL SUPPLY — 39 items
BARRIER ADHS 3X4 INTERCEED (GAUZE/BANDAGES/DRESSINGS) ×3 IMPLANT
BENZOIN TINCTURE PRP APPL 2/3 (GAUZE/BANDAGES/DRESSINGS) ×3 IMPLANT
CLAMP CORD UMBIL (MISCELLANEOUS) IMPLANT
CLOSURE WOUND 1/2 X4 (GAUZE/BANDAGES/DRESSINGS) ×1
CLOTH BEACON ORANGE TIMEOUT ST (SAFETY) ×3 IMPLANT
DRAPE SHEET LG 3/4 BI-LAMINATE (DRAPES) IMPLANT
DRSG OPSITE POSTOP 4X10 (GAUZE/BANDAGES/DRESSINGS) ×3 IMPLANT
DURAPREP 26ML APPLICATOR (WOUND CARE) ×3 IMPLANT
ELECT REM PT RETURN 9FT ADLT (ELECTROSURGICAL) ×3
ELECTRODE REM PT RTRN 9FT ADLT (ELECTROSURGICAL) ×1 IMPLANT
EXTRACTOR VACUUM KIWI (MISCELLANEOUS) IMPLANT
GLOVE BIO SURGEON ST LM GN SZ9 (GLOVE) ×3 IMPLANT
GLOVE BIOGEL PI IND STRL 9 (GLOVE) ×1 IMPLANT
GLOVE BIOGEL PI INDICATOR 9 (GLOVE) ×2
GOWN STRL REUS W/TWL 2XL LVL3 (GOWN DISPOSABLE) ×3 IMPLANT
GOWN STRL REUS W/TWL LRG LVL3 (GOWN DISPOSABLE) ×3 IMPLANT
NEEDLE HYPO 25X5/8 SAFETYGLIDE (NEEDLE) IMPLANT
NS IRRIG 1000ML POUR BTL (IV SOLUTION) ×3 IMPLANT
PACK C SECTION WH (CUSTOM PROCEDURE TRAY) ×3 IMPLANT
PAD ABD 8X7 1/2 STERILE (GAUZE/BANDAGES/DRESSINGS) ×3 IMPLANT
PAD OB MATERNITY 4.3X12.25 (PERSONAL CARE ITEMS) ×3 IMPLANT
RETRACTOR WND ALEXIS 25 LRG (MISCELLANEOUS) ×1 IMPLANT
RTRCTR C-SECT PINK 25CM LRG (MISCELLANEOUS) IMPLANT
RTRCTR C-SECT PINK 34CM XLRG (MISCELLANEOUS) IMPLANT
RTRCTR WOUND ALEXIS 25CM LRG (MISCELLANEOUS) ×3
SPONGE GAUZE 4X4 12PLY STER LF (GAUZE/BANDAGES/DRESSINGS) ×3 IMPLANT
STRIP CLOSURE SKIN 1/2X4 (GAUZE/BANDAGES/DRESSINGS) ×2 IMPLANT
SUT MNCRL 0 VIOLET CTX 36 (SUTURE) ×2 IMPLANT
SUT MONOCRYL 0 CTX 36 (SUTURE) ×4
SUT VIC AB 0 CT1 27 (SUTURE) ×2
SUT VIC AB 0 CT1 27XBRD ANBCTR (SUTURE) ×1 IMPLANT
SUT VIC AB 2-0 CT1 27 (SUTURE) ×2
SUT VIC AB 2-0 CT1 TAPERPNT 27 (SUTURE) ×1 IMPLANT
SUT VIC AB 4-0 KS 27 (SUTURE) ×3 IMPLANT
SYR BULB IRRIGATION 50ML (SYRINGE) IMPLANT
TAPE HYPAFIX 4 X10 (GAUZE/BANDAGES/DRESSINGS) ×3 IMPLANT
TOWEL OR 17X24 6PK STRL BLUE (TOWEL DISPOSABLE) ×3 IMPLANT
TRAY FOLEY CATH 14FR (SET/KITS/TRAYS/PACK) ×3 IMPLANT
WATER STERILE IRR 1000ML POUR (IV SOLUTION) ×3 IMPLANT

## 2014-04-06 NOTE — Anesthesia Postprocedure Evaluation (Signed)
  Anesthesia Post-op Note  Patient: Teresa CottonGloria Ord  Procedure(s) Performed: Procedure(s): CESAREAN SECTION (N/A)  Patient Location: PACU  Anesthesia Type:Spinal  Level of Consciousness: awake and alert   Airway and Oxygen Therapy: Patient Spontanous Breathing  Post-op Pain: none  Post-op Assessment: Post-op Vital signs reviewed, Patient's Cardiovascular Status Stable, Respiratory Function Stable, Patent Airway, No signs of Nausea or vomiting and Pain level controlled  Post-op Vital Signs: Reviewed and stable  Last Vitals:  Filed Vitals:   04/06/14 1343  BP: 104/45  Pulse: 61  Temp: 36.8 C  Resp: 20    Complications: No apparent anesthesia complications

## 2014-04-06 NOTE — Anesthesia Procedure Notes (Signed)
Spinal  Patient location during procedure: OR Start time: 04/06/2014 9:37 AM End time: 04/06/2014 9:41 AM Staffing Anesthesiologist: Lashawn Orrego, CHRIS Preanesthetic Checklist Completed: patient identified, surgical consent, pre-op evaluation, timeout performed, IV checked, risks and benefits discussed and monitors and equipment checked Spinal Block Patient position: sitting Prep: site prepped and draped and DuraPrep Patient monitoring: heart rate, cardiac monitor, continuous pulse ox and blood pressure Approach: midline Location: L4-5 Injection technique: single-shot Needle Needle type: Pencan  Needle gauge: 24 G Needle length: 10 cm Assessment Sensory level: T4

## 2014-04-06 NOTE — Plan of Care (Signed)
Problem: Phase I Progression Outcomes Goal: VS, stable, temp < 100.4 degrees F Orthostatic vital signs WNL. Tolerated ambulation to the bathroom well without pain.

## 2014-04-06 NOTE — Anesthesia Postprocedure Evaluation (Signed)
  Anesthesia Post-op Note  Patient: Teresa CottonGloria Washington  Procedure(s) Performed: Procedure(s): CESAREAN SECTION (N/A)  Patient Location: Mother/Baby  Anesthesia Type:Spinal  Level of Consciousness: awake and alert   Airway and Oxygen Therapy: Patient Spontanous Breathing  Post-op Pain: mild  Post-op Assessment: Post-op Vital signs reviewed, Patient's Cardiovascular Status Stable, Respiratory Function Stable, No signs of Nausea or vomiting, Pain level controlled, No headache, No residual numbness and No residual motor weakness  Post-op Vital Signs: Reviewed  Last Vitals:  Filed Vitals:   04/06/14 1707  BP: 116/57  Pulse: 69  Temp:   Resp:     Complications: No apparent anesthesia complications

## 2014-04-06 NOTE — Anesthesia Preprocedure Evaluation (Signed)
Anesthesia Evaluation  Patient identified by MRN, date of birth, ID band Patient awake    Reviewed: Allergy & Precautions, H&P , NPO status , Patient's Chart, lab work & pertinent test results  History of Anesthesia Complications Negative for: history of anesthetic complications  Airway Mallampati: I TM Distance: >3 FB Neck ROM: Full    Dental  (+) Teeth Intact   Pulmonary neg shortness of breath, asthma , neg sleep apnea, neg COPDneg recent URI, former smoker,  breath sounds clear to auscultation        Cardiovascular negative cardio ROS  Rhythm:Regular     Neuro/Psych  Headaches,    GI/Hepatic negative GI ROS, Neg liver ROS,   Endo/Other  Morbid obesity  Renal/GU negative Renal ROS     Musculoskeletal   Abdominal   Peds  Hematology  (+) anemia ,   Anesthesia Other Findings   Reproductive/Obstetrics (+) Pregnancy                           Anesthesia Physical Anesthesia Plan  ASA: II  Anesthesia Plan: Spinal   Post-op Pain Management:    Induction:   Airway Management Planned:   Additional Equipment: None  Intra-op Plan:   Post-operative Plan:   Informed Consent: I have reviewed the patients History and Physical, chart, labs and discussed the procedure including the risks, benefits and alternatives for the proposed anesthesia with the patient or authorized representative who has indicated his/her understanding and acceptance.   Dental advisory given  Plan Discussed with: CRNA and Surgeon  Anesthesia Plan Comments:         Anesthesia Quick Evaluation

## 2014-04-06 NOTE — Interval H&P Note (Signed)
History and Physical Interval Note:  04/06/2014 8:46 AM  Teresa CottonGloria Speaker  has presented today for surgery, with the diagnosis of previous c-section  The various methods of treatment have been discussed with the patient and family. After consideration of risks, benefits and other options for treatment, the patient has consented to  Procedure(s): CESAREAN SECTION (N/A) as a surgical intervention .  The patient's history has been reviewed, patient examined, no change in status, stable for surgery.  I have reviewed the patient's chart and labs.  Questions were answered to the patient's satisfaction.  Pt has been NPO since 5 pm.   Tilda BurrowFERGUSON,Arham Symmonds V

## 2014-04-06 NOTE — Addendum Note (Signed)
Addendum created 04/06/14 1731 by Angela Adamana G Damoney Julia, CRNA   Modules edited: Notes Section   Notes Section:  File: [id number]

## 2014-04-06 NOTE — H&P (View-Only) (Signed)
Teresa Washington is a 23 y.o. female presenting for  Repeat Cesarean Section. Indications Previous Cesarean Section declining Trial of labor T. History this 23 yr old female [redacted]w[redacted]d G2P1001 followed with limited prenatal care at [facility] is admitted for repeat cesarean section after presenting for her routine prenatal visit today, having reached 40 wk 4 days, and having no interest in considering Induction of labor, and requesting repeat cesarean section. She had a prior induction at 41 weeks, and after a 2 day induction was taken for cesarean delivery. The cervix is posterior long and firm, with a high presenting part, and pt is not interested in the possibilty of a long induction resulting in the same result as last labor induction. NST is reactive, with 2 small variable decelerations noted, and AFI is normal at 10.2.  Fetal movement remains normal.She is scheduled for Cesarean in the morning.  Prenatal course has been notable for limited prenatal care, with second trimester onset of care, failure to keep prenatal appts from 28-37 weeks, and treatment x 2 for Chlamydia, with proof of cure collected again today, 10/16. Trichomonas vaginitis dx'd again today, and treatment called in to pt pharmacy of choice with instructions to complete given to pt. If pt did not fill rx, will give IV Flagyl x 1.  OB History   Grav Para Term Preterm Abortions TAB SAB Ect Mult Living   2 1 1       1     Past Medical History  Diagnosis Date  . Anemia   . Asthma   . Bronchitis   . Supervision of other normal pregnancy 12/06/2013   Past Surgical History  Procedure Laterality Date  . Cesarean section     Family History: family history includes Asthma in her brother; Diabetes in her maternal grandfather, maternal grandmother, mother, paternal grandfather, paternal grandmother, and sister; Heart disease in her father; Kidney failure in her maternal grandmother. Social History:  reports that she has quit smoking.  Her smoking use included Cigarettes. She smoked 0.00 packs per day. She has never used smokeless tobacco. She reports that she does not drink alcohol or use illicit drugs.   Prenatal Transfer Tool  Maternal Diabetes: never got Prenatal screen due to missed appts Genetic Screening: Declined due to late presentation Maternal Ultrasounds/Referrals: Normal only u/s at 28 wks normal female infant, normal anatomy scan, corresponds to menstrual EDD within 5 days. Fetal Ultrasounds or other Referrals:  None Maternal Substance Abuse:  Yes:  Type: Marijuana Significant Maternal Medications:  Meds include: Other:  metronidazole rx'd 10 /16 Significant Maternal Lab Results:  Lab values include: Group B Strep positive Other Comments:    ROS    There were no vitals taken for this visit. Exam Physical Exam  Constitutional: She is oriented to person, place, and time. She appears well-developed and well-nourished.  Poor hygiene, clothing soiled  HENT:  Head: Normocephalic.  Eyes: Pupils are equal, round, and reactive to light.  Neck: Neck supple.  Cardiovascular: Normal rate.   Respiratory: Effort normal.  GI: Soft.  Gravid uterus consistent with term gestation  Genitourinary:  Profuse d/c  + trich,rx'd 10 16 Speculum check Cervix long closed Digital exam: long closed posterior with ballottable presenting part at -3 station U/S shows normal AFI  , good fetal activity.  NST reactive   Neurological: She is alert and oriented to person, place, and time.  Skin: Skin is warm and dry.  Psychiatric: She has a normal mood and affect.   Her behavior is normal.    Prenatal labs: ABO, Rh: O/Positive/-- (02/24 0000) Antibody: NEG (10/09 0916) Rubella: Immune (02/25 0000) RPR: NON REAC (10/09 0916)  HBsAg: Negative (02/24 0000)  HIV: NONREACTIVE (10/09 16100916)  GBS: Detected (09/22 1218)   Assessment/Plan: Pregnancy 40 wk 4 days Trichomonas Vaginitis Hx Chlamydia x 2. Poor prenatal care  Repeat  cesarean in a.m. Risks benefits rationale reviewed with patient.  Pt given NPO instructions, to arrive at hospital at 7 am. Surgery scheduled for 9 am.   Tilda BurrowFERGUSON,Belmira Daley V 04/05/2014, 9:38 PM

## 2014-04-06 NOTE — Transfer of Care (Signed)
Immediate Anesthesia Transfer of Care Note  Patient: Teresa CottonGloria Lovan  Procedure(s) Performed: Procedure(s): CESAREAN SECTION (N/A)  Patient Location: PACU  Anesthesia Type:Spinal  Level of Consciousness: awake and alert   Airway & Oxygen Therapy: Patient Spontanous Breathing  Post-op Assessment: Report given to PACU RN and Post -op Vital signs reviewed and stable  Post vital signs: Reviewed and stable  Complications: No apparent anesthesia complications

## 2014-04-06 NOTE — Op Note (Signed)
Preop: Pregnancy 40 weeks 5 days prior cesarean section declining trial of labor, cervical unfavorable Chlamydia cervicitis, untreated also Trichomonas vaginitis untreated Postop: Same delivered, also uterine fundal adhesions to anterior abdominal wall, transected Procedure: Repeat low transverse cervical cesarean section, transection of uterine fundus adhesions, placement of Interceed adhesion barrier Surgeon Emelda FearFerguson AsstAmie Critchley. Blackwell, CST Anesthesia spinal Complications none Findings healthy infant Apgars 9 and 9 see pediatrics notes                   Extensive adhesions, dense fundal adhesions from the uterus to the anterior abdominal wall, transected, sutured and covered with Interceed Estimated blood loss: 600 cc Details of procedure patient was taken to the operating room prepped and draped for lower abdominal surgery. Timeout was conducted. Spinal anesthesia had been in place Ancef was administered preoperatively, and surgical procedure confirmed by operative team. Transverse lower abdominal incision was made along the lines of the previous incision a, excising the old cicatrix. Sharp dissection to the fascia was performed and the fascia opened transversely. There was extensive dense fibrosis throughout subcutaneous fatty tissue as well as the fascia was densely adherent to the back of the rectus muscles midline could be opened, and with difficulty we were able to identify the anterior peritoneum. There were thin filmy adhesions all along the anterior uterus. There were dense firm adhesions at the fundus of the uterus. Bladder flap was minimally developed, Alexis wound retractor positioned, and transverse uterine incision made in the lower uterine segment and extended laterally using index finger traction, and the fetal vertex rotated into the incision and delivered without difficulty the cord was clamped and baby passed to the waiting pediatricians. Placenta delivered easily intact, and response  to uterine massage. Uterus was swabbed out and was hemostatic. Uterus was closed from the side sewing to the midline with running locking 0 Monocryl first layer and oversewn with continuous running 0 Monocryl. Hemostasis was satisfactory. Adhesio lysis. Attention was then directed to the dense fundal adhesions to the back of the rectus muscles just left of the midline. The omentum was not in the way, and bowel was not in the way. The operator's index finger could be placed behind the dense adhesions, and taken be transected sharply using Bovie cautery and maintaining good visualization. The stalk of the adhesion was primarily on the anterior abdominal wall. The bed of the uterine fundus into the adhesions planned sufficiently that 3 horizontal mattress sutures of 2-0 Vicryl were necessary to improve hemostasis. Small amount of point cautery was necessary. Hemostasis was subsequently satisfactory and Surgicel was placed over the anterior uterus. Anterior peritoneum was somewhat distorted and disrupted by the dissection but what was left of the anterior peritoneal edges was pulled into the midline with a continuous running 2-0 Vicryl fascia was closed with running 0 Vicryl, the subcutaneous tissues reapproximated with interrupted sutures of 2-0 Vicryl, after loosening the fibrotic inferior margin of the incision at the level of subcutaneous tissues. The subcutaneous fatty tissue was satisfactorily reapproximated and then subcuticular 4-0 Vicryl closure of the skin completed the procedure pressure dressing was applied after Steri-Strips and benzoin utilized. Patient to recovery in stable condition   Addendum due to the patient's noncompliance with prescriptions for metronidazole and a azithromycin administered yesterday in the office, the patient will be given oral azithromycin 1 g and Flagyl 500 mg twice a day while in the hospital for her severe Trichomonas vaginitis and Chlamydia

## 2014-04-07 ENCOUNTER — Encounter (HOSPITAL_COMMUNITY): Payer: Self-pay

## 2014-04-07 LAB — CBC
HCT: 30.2 % — ABNORMAL LOW (ref 36.0–46.0)
Hemoglobin: 10.7 g/dL — ABNORMAL LOW (ref 12.0–15.0)
MCH: 30.5 pg (ref 26.0–34.0)
MCHC: 35.4 g/dL (ref 30.0–36.0)
MCV: 86 fL (ref 78.0–100.0)
PLATELETS: 224 10*3/uL (ref 150–400)
RBC: 3.51 MIL/uL — ABNORMAL LOW (ref 3.87–5.11)
RDW: 13.8 % (ref 11.5–15.5)
WBC: 12.2 10*3/uL — ABNORMAL HIGH (ref 4.0–10.5)

## 2014-04-07 LAB — STREP B DNA PROBE: STREP GROUP B AG: DETECTED

## 2014-04-07 MED ORDER — RHO D IMMUNE GLOBULIN 1500 UNIT/2ML IJ SOSY
300.0000 ug | PREFILLED_SYRINGE | Freq: Once | INTRAMUSCULAR | Status: AC
  Administered 2014-04-07: 300 ug via INTRAVENOUS
  Filled 2014-04-07: qty 2

## 2014-04-07 NOTE — Progress Notes (Signed)
CNM called and informed of patient's saturated honeycomb and pressure dressing. Instructed to change both honeycomb and pressure dressing. Nurse to continue to monitor.

## 2014-04-07 NOTE — Progress Notes (Addendum)
Subjective: Postpartum Day 1: Cesarean Delivery Patient reports tolerating PO, + flatus and no problems voiding.    Objective: Vital signs in last 24 hours: Temp:  [98 F (36.7 C)-99 F (37.2 C)] 98.1 F (36.7 C) (10/18 0335) Pulse Rate:  [58-93] 67 (10/18 0335) Resp:  [15-30] 16 (10/18 0335) BP: (97-127)/(39-98) 104/44 mmHg (10/18 0335) SpO2:  [97 %-100 %] 99 % (10/18 0335) Weight:  [102.059 kg (225 lb)] 102.059 kg (225 lb) (10/17 1249)  Physical Exam:  General: alert, cooperative and no distress Lochia: appropriate Uterine Fundus: firm Incision: no significant drainage DVT Evaluation: No evidence of DVT seen on physical exam. Negative Homan's sign. No cords or calf tenderness. No significant calf/ankle edema.   Recent Labs  04/06/14 0810 04/07/14 0611  HGB 12.5 10.7*  HCT 35.1* 30.2*    Assessment/Plan: Status post Cesarean section. Doing well postoperatively.  Plan for D/C tomorrow. Plans Nexplanon for contraception and [facility]  LEFTWICH-KIRBY, Hao Dion 04/07/2014, 7:16 AM

## 2014-04-07 NOTE — Plan of Care (Signed)
Problem: Phase I Progression Outcomes Goal: Voiding adequately Outcome: Progressing Pt had decreased urine output with cath; 10500ml/2hr of LR bolus given per MD order

## 2014-04-08 ENCOUNTER — Encounter (HOSPITAL_COMMUNITY): Payer: Self-pay | Admitting: Obstetrics and Gynecology

## 2014-04-08 LAB — RH IG WORKUP (INCLUDES ABO/RH)
ABO/RH(D): O NEG
Fetal Screen: NEGATIVE
Gestational Age(Wks): 40.5
Unit division: 0

## 2014-04-08 MED ORDER — METRONIDAZOLE 500 MG PO TABS
2000.0000 mg | ORAL_TABLET | Freq: Once | ORAL | Status: AC
  Administered 2014-04-08: 2000 mg via ORAL
  Filled 2014-04-08: qty 4

## 2014-04-08 NOTE — Discharge Summary (Signed)
Patient seen and examined.  Agree with above note.  Levie HeritageJacob J Stinson, DO 04/08/2014 1:07 PM

## 2014-04-08 NOTE — Progress Notes (Signed)
UR chart review completed.  

## 2014-04-08 NOTE — Progress Notes (Signed)
Clinical Social Work Department PSYCHOSOCIAL ASSESSMENT - MATERNAL/CHILD 04/08/2014  Patient:  Teresa Washington, Teresa Washington  Account Number:  [id number]  Admit Date:  04/06/2014  Ardine Eng Name:   Mayo Ao   Clinical Social Worker:  Lucita Ferrara, CLINICAL SOCIAL WORKER   Date/Time:  04/08/2014 09:30 AM  Date Referred:  04/07/2014   Referral source  Central Nursery     Referred reason  [facility]  Substance Abuse   Other referral source:    I:  FAMILY / HOME ENVIRONMENT Child's legal guardian:  PARENT  Guardian - Name Guardian - Age Guardian - Address  Malik Ruffino [address] [place], [place] [address]  Payton Spark  different residence   Other household support members/support persons Name Relationship DOB  Coleman 2014   Other support:   MOB identified her mother and the FOB as her primary supporters.  She stated that her sister is helping provide care for her daugther while she is at the hospital.    II  PSYCHOSOCIAL DATA Information Source:  Family Interview  Financial and [employer] Employment:   MOB stated that she is unemployed. She stated that the FOB and her mother ensure that all basic needs are met.   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  She denied use during current pregnancy.  Baby's UDS is negative and meconium is pending.  Other - See  comment Y N MOB presents with lapse in prenatal care from week 28-37.    V  SOCIAL WORK ASSESSMENT CSW met with the MOB in her room in order to complete the assessment. Consult was ordered due to MOB presenting with history of THC use and a lapse in prenatal care.  MOB was receptive to the visit and was easily engaged.  MOB provided consent for the FOB and MGM to be present.  MOB displayed an appropriate range in affect and presented in a pleasant.  She smiled frequently and was observed to be attentive and bonding with the baby.  MOB openly discussed THC use and answered questions appropriately.   MOB expressed excitement as she transitions into the postpartum period.  She denied feeling nervous about having a newborn so quickly after her first daughter.  She acknowledged that it will be a transition at first, but expressed confidence in her ability to cope since she feels well supported by her mother and the FOB.  MOB shared that her family has been helpful since they have ensured that all basic items are secured for the baby.  MOB denied barriers to accessing help/support.  The MGM stated that she intends to be present frequently and shared that the MOB will not even have to ask for help.   MOB denied mental health history and denied history of postpartum depression.  MOB admitted to [facility] use, and shared that she has  not smoked THC since June.  She shared that she smoked on a regular basis in order to assist her with nausea.  MOB did not identify THC use as a problem, and verbalized understanding of hospital drug screen policy.  MOB did not express any concerns about CPS involvement if the baby's meconium is positive, and she denied history of CPS involvement.     When CSW inquired about lapse in care, MOB stated that she was unable to attend since she relies on the [facility] for transportation. The MGM stated that her car was not working, but it has since be repaired. MOB denied any additional barriers to  attending follow-up questions and verbalized understanding of the importance of attending pediatrician appointments.   No barriers to discharge.     VI SOCIAL WORK PLAN  Type of pt/family education:   Postpartum depression and hospital drug screen policy   If child protective services report - county:   If child protective services report - date:   Information/referral to community resources comment:   No referrals needed at this time. MOB did not identify her THC use as problematic.   Other social work plan:   CSW to monitor meconium drug screen and will make CPS report if needed.

## 2014-04-08 NOTE — Discharge Summary (Signed)
Teresa Washington is a [id number] admitted at 7555w5d for repeat LTCS, treated w azithromycin and flagyl for GBS+, chlamydia, and trich. Bottle feeding. Plans for nexplanon at 10 day f/u.      Obstetric Discharge Summary Reason for Admission: cesarean section Prenatal Procedures: none Intrapartum Procedures: cesarean: low cervical, transverse Postpartum Procedures: none Complications-Operative and Postpartum: none Hemoglobin  Date Value Ref Range Status  04/07/2014 10.7* 12.0 - 15.0 g/dL Final  8/41/32442/25/2015 01.012.4   Final     HCT  Date Value Ref Range Status  04/07/2014 30.2* 36.0 - 46.0 % Final  08/15/2013 37   Final    Physical Exam:  General: alert, cooperative and no distress Lochia: appropriate Uterine Fundus: firm Incision: healing well, no significant drainage, no dehiscence DVT Evaluation: No evidence of DVT seen on physical exam.  Discharge Diagnoses: Term Pregnancy-delivered  Discharge Information: Date: 04/08/2014 Activity: pelvic rest Diet: routine Medications: Ibuprofen Condition: stable Instructions: refer to practice specific booklet Discharge to: home Follow-up Information   Follow up with [facility] In 6 weeks.   Contact information:   [address] [place] [id number] [phone]      Newborn Data: Live born female  Birth Weight: 5 lb 13.1 oz (2639 g) APGAR: 8, 9  Home with mother.  Teresa Washington, Teresa 04/08/2014, 8:00 AM  OB fellow attestation Post Partum Day 2 I have seen and examined this patient and agree with above documentation in the resident's note.   Teresa Washington is a 23 y.o. G2P2002 s/p rLTCS.  Pt denies problems with ambulating, voiding or po intake. Pain is well controlled.  Plan for birth control is nexplanon.  Method of Feeding: bottle  PE:  BP 115/50  Pulse 69  Temp(Src) 98.6 F (37 C) (Oral)  Resp 18  Ht 5\' 2"  (1.575 m)  Wt 225 lb (102.059 kg)  BMI 41.14 kg/m2  SpO2 97%  Breastfeeding? Unknown Fundus  firm  Plan for discharge: today - s/p tx of trich and chlamydia (1g azithromycin and 2g flagyl), advised to have partner treated and to abstain from intercourse until 1 week post-treatment (also 6 weeks pelvic rest)  Perry MountACOSTA,Aubriel Khanna ROCIO, MD 11:45 AM

## 2014-04-09 NOTE — Progress Notes (Signed)
Pt scheduled for cesarean on Saturday after counsel. Pt no longer wants VBAC, particularly wants to avoid long IOL.

## 2014-04-15 ENCOUNTER — Encounter: Payer: Medicaid Other | Admitting: Obstetrics and Gynecology

## 2014-04-17 ENCOUNTER — Encounter: Payer: Medicaid Other | Admitting: Obstetrics and Gynecology

## 2014-04-19 ENCOUNTER — Encounter: Payer: Medicaid Other | Admitting: Obstetrics and Gynecology

## 2014-04-22 ENCOUNTER — Encounter (HOSPITAL_COMMUNITY): Payer: Self-pay | Admitting: Obstetrics and Gynecology

## 2014-04-23 ENCOUNTER — Encounter: Payer: Medicaid Other | Admitting: Adult Health

## 2014-04-26 ENCOUNTER — Encounter: Payer: Self-pay | Admitting: Adult Health

## 2014-04-26 ENCOUNTER — Encounter: Payer: Medicaid Other | Admitting: Adult Health

## 2014-05-28 ENCOUNTER — Ambulatory Visit: Payer: Medicaid Other | Admitting: Advanced Practice Midwife

## 2014-05-28 ENCOUNTER — Encounter: Payer: Self-pay | Admitting: Advanced Practice Midwife
